# Patient Record
Sex: Male | Born: 1945 | Race: White | Hispanic: No | Marital: Married | Smoking: Never smoker
Health system: Southern US, Community
[De-identification: ages and names within clinical notes are randomized; demographics above are authoritative.]

## PROBLEM LIST (undated history)

## (undated) DIAGNOSIS — I499 Cardiac arrhythmia, unspecified: Secondary | ICD-10-CM

## (undated) DIAGNOSIS — F419 Anxiety disorder, unspecified: Secondary | ICD-10-CM

## (undated) DIAGNOSIS — C801 Malignant (primary) neoplasm, unspecified: Secondary | ICD-10-CM

## (undated) DIAGNOSIS — M1990 Unspecified osteoarthritis, unspecified site: Secondary | ICD-10-CM

## (undated) DIAGNOSIS — F32A Depression, unspecified: Secondary | ICD-10-CM

## (undated) DIAGNOSIS — F329 Major depressive disorder, single episode, unspecified: Secondary | ICD-10-CM

## (undated) HISTORY — PX: PROSTATE BIOPSY: SHX241

---
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
## 2008-06-18 HISTORY — PX: COLONOSCOPY: SHX174

## 2011-06-19 DIAGNOSIS — C801 Malignant (primary) neoplasm, unspecified: Secondary | ICD-10-CM

## 2011-06-19 HISTORY — PX: CT RADIATION THERAPY GUIDE: HXRAD513

## 2011-06-19 HISTORY — DX: Malignant (primary) neoplasm, unspecified: C80.1

## 2018-09-19 ENCOUNTER — Encounter: Admission: RE | Payer: Self-pay | Source: Home / Self Care

## 2018-09-19 ENCOUNTER — Ambulatory Visit: Admission: RE | Admit: 2018-09-19 | Payer: Self-pay | Source: Home / Self Care | Admitting: Gastroenterology

## 2018-09-19 SURGERY — COLONOSCOPY WITH PROPOFOL
Anesthesia: General

## 2019-02-24 ENCOUNTER — Other Ambulatory Visit: Payer: Self-pay

## 2019-02-24 ENCOUNTER — Other Ambulatory Visit
Admission: RE | Admit: 2019-02-24 | Discharge: 2019-02-24 | Disposition: A | Discharge status: Home / Self Care | Payer: Medicare Other | Source: Ambulatory Visit | Attending: Gastroenterology | Admitting: Gastroenterology

## 2019-02-24 DIAGNOSIS — Z01812 Encounter for preprocedural laboratory examination: Secondary | ICD-10-CM | POA: Insufficient documentation

## 2019-02-24 DIAGNOSIS — Z20828 Contact with and (suspected) exposure to other viral communicable diseases: Secondary | ICD-10-CM | POA: Diagnosis not present

## 2019-02-24 LAB — SARS CORONAVIRUS 2 (TAT 6-24 HRS): SARS Coronavirus 2: NEGATIVE

## 2019-02-26 ENCOUNTER — Ambulatory Visit: Payer: Medicare Other | Admitting: Anesthesiology

## 2019-02-26 ENCOUNTER — Encounter
Admission: RE | Disposition: A | Discharge status: Home / Self Care | Payer: Self-pay | Source: Home / Self Care | Attending: Gastroenterology

## 2019-02-26 ENCOUNTER — Ambulatory Visit
Admission: RE | Admit: 2019-02-26 | Discharge: 2019-02-26 | Disposition: A | Discharge status: Home / Self Care | Payer: Medicare Other | Attending: Gastroenterology | Admitting: Gastroenterology

## 2019-02-26 ENCOUNTER — Other Ambulatory Visit: Payer: Self-pay

## 2019-02-26 DIAGNOSIS — K644 Residual hemorrhoidal skin tags: Secondary | ICD-10-CM | POA: Diagnosis not present

## 2019-02-26 DIAGNOSIS — Z791 Long term (current) use of non-steroidal anti-inflammatories (NSAID): Secondary | ICD-10-CM | POA: Diagnosis not present

## 2019-02-26 DIAGNOSIS — F329 Major depressive disorder, single episode, unspecified: Secondary | ICD-10-CM | POA: Insufficient documentation

## 2019-02-26 DIAGNOSIS — F419 Anxiety disorder, unspecified: Secondary | ICD-10-CM | POA: Diagnosis not present

## 2019-02-26 DIAGNOSIS — Z1211 Encounter for screening for malignant neoplasm of colon: Secondary | ICD-10-CM | POA: Insufficient documentation

## 2019-02-26 DIAGNOSIS — I499 Cardiac arrhythmia, unspecified: Secondary | ICD-10-CM | POA: Diagnosis not present

## 2019-02-26 DIAGNOSIS — Z79899 Other long term (current) drug therapy: Secondary | ICD-10-CM | POA: Insufficient documentation

## 2019-02-26 DIAGNOSIS — Z7982 Long term (current) use of aspirin: Secondary | ICD-10-CM | POA: Insufficient documentation

## 2019-02-26 DIAGNOSIS — K5730 Diverticulosis of large intestine without perforation or abscess without bleeding: Secondary | ICD-10-CM | POA: Diagnosis not present

## 2019-02-26 DIAGNOSIS — D123 Benign neoplasm of transverse colon: Secondary | ICD-10-CM | POA: Insufficient documentation

## 2019-02-26 DIAGNOSIS — K648 Other hemorrhoids: Secondary | ICD-10-CM | POA: Diagnosis not present

## 2019-02-26 DIAGNOSIS — M1990 Unspecified osteoarthritis, unspecified site: Secondary | ICD-10-CM | POA: Diagnosis not present

## 2019-02-26 DIAGNOSIS — Z8546 Personal history of malignant neoplasm of prostate: Secondary | ICD-10-CM | POA: Insufficient documentation

## 2019-02-26 DIAGNOSIS — K5520 Angiodysplasia of colon without hemorrhage: Secondary | ICD-10-CM | POA: Diagnosis not present

## 2019-02-26 HISTORY — DX: Depression, unspecified: F32.A

## 2019-02-26 HISTORY — DX: Cardiac arrhythmia, unspecified: I49.9

## 2019-02-26 HISTORY — DX: Unspecified osteoarthritis, unspecified site: M19.90

## 2019-02-26 HISTORY — DX: Anxiety disorder, unspecified: F41.9

## 2019-02-26 HISTORY — PX: COLONOSCOPY WITH PROPOFOL: SHX5780

## 2019-02-26 SURGERY — COLONOSCOPY WITH PROPOFOL
Anesthesia: General

## 2019-02-26 MED ORDER — PROPOFOL 10 MG/ML IV BOLUS
INTRAVENOUS | Status: AC
  Filled 2019-02-26: qty 20

## 2019-02-26 MED ORDER — PROPOFOL 10 MG/ML IV BOLUS
INTRAVENOUS | Status: DC | PRN
  Administered 2019-02-26: 20 mg via INTRAVENOUS
  Administered 2019-02-26: 60 mg via INTRAVENOUS

## 2019-02-26 MED ORDER — SODIUM CHLORIDE 0.9 % IV SOLN
INTRAVENOUS | Status: DC
  Administered 2019-02-26 (×2): via INTRAVENOUS

## 2019-02-26 MED ORDER — PROPOFOL 500 MG/50ML IV EMUL
INTRAVENOUS | Status: DC | PRN
  Administered 2019-02-26: 180 ug/kg/min via INTRAVENOUS

## 2019-02-26 MED ORDER — PHENYLEPHRINE HCL (PRESSORS) 10 MG/ML IV SOLN
INTRAVENOUS | Status: DC | PRN
  Administered 2019-02-26 (×2): 100 ug via INTRAVENOUS

## 2019-02-26 MED ORDER — PROPOFOL 500 MG/50ML IV EMUL
INTRAVENOUS | Status: AC
  Filled 2019-02-26: qty 50

## 2019-02-26 NOTE — H&P (Signed)
Outpatient short stay form Pre-procedure 02/26/2019 10:24 AM Lollie Sails MD  Primary Physician: Dr. Marval Regal, [facility]  Reason for visit: Colonoscopy  History of present illness: Patient is a 73 year old male presenting today for colonoscopy in regards to colon cancer screening.  His last colonoscopy was about 12 years ago.  He does take 81 mg aspirin daily but no other aspirin product or blood thinning agent.  He tolerated his prep well.    Current Facility-Administered Medications:  .  0.9 %  sodium chloride infusion, , Intravenous, Continuous, Lollie Sails, MD  Medications Prior to Admission  Medication Sig Dispense Refill Last Dose  . acetaminophen (TYLENOL) 500 MG tablet Take 500 mg by mouth every 6 (six) hours as needed.   02/25/2019 at Unknown time  . aspirin EC 81 MG tablet Take 81 mg by mouth.   02/21/2019  . diltiazem (CARDIZEM SR) 120 MG 12 hr capsule Take 120 mg by mouth 2 (two) times daily.   02/21/2019  . meloxicam (MOBIC) 7.5 MG tablet Take 7.5 mg by mouth daily.   02/21/2019  . multivitamin-iron-minerals-folic acid (THERAPEUTIC-M) TABS tablet Take 1 tablet by mouth daily.   02/21/2019  . tadalafil (CIALIS) 10 MG tablet Take 10 mg by mouth.     Marland Kitchen aspirin 325 MG tablet Take 325 mg by mouth.   Not Taking at Unknown time  . buPROPion (WELLBUTRIN XL) 150 MG 24 hr tablet Take 150 mg by mouth.   Not Taking at Unknown time  . escitalopram (LEXAPRO) 10 MG tablet Take 10 mg by mouth.   Not Taking at Unknown time  . naproxen sodium (ALEVE) 220 MG tablet Take 220 mg by mouth.   Not Taking at Unknown time     No Known Allergies   Past Medical History:  Diagnosis Date  . Anxiety   . Arthritis    osteo  . Cancer [facility]) 2013   prostate  . Depression   . Dysrhythmia     Review of systems:      Physical Exam    Heart and lungs: Regular rate and rhythm without rub or gallop lungs are bilaterally clear    HEENT: Normocephalic atraumatic eyes are  anicteric    Other:     Pertinant exam for procedure: Soft nontender nondistended bowel sounds positive normoactive    Planned proceedures: Colonoscopy and indicated procedures. I have discussed the risks benefits and complications of procedures to include not limited to bleeding, infection, perforation and the risk of sedation and the patient wishes to proceed.    Lollie Sails, MD Gastroenterology 02/26/2019  10:24 AM

## 2019-02-26 NOTE — Anesthesia Preprocedure Evaluation (Addendum)
Anesthesia Evaluation  Patient identified by MRN, date of birth, ID band Patient awake    Reviewed: Allergy & Precautions, H&P , NPO status , Patient's Chart, lab work & pertinent test results  Airway Mallampati: II  TM Distance: >3 FB     Dental  (+) Teeth Intact   Pulmonary neg pulmonary ROS, neg COPD,           Cardiovascular (-) angina(-) Past MI and (-) Cardiac Stents + dysrhythmias      Neuro/Psych PSYCHIATRIC DISORDERS Anxiety Depression negative neurological ROS     GI/Hepatic negative GI ROS, Neg liver ROS,   Endo/Other  negative endocrine ROS  Renal/GU negative Renal ROS  negative genitourinary   Musculoskeletal   Abdominal   Peds  Hematology negative hematology ROS (+)   Anesthesia Other Findings Past Medical History: No date: Anxiety No date: Arthritis     Comment:  osteo 2013: Cancer ([place])     Comment:  prostate No date: Depression No date: Dysrhythmia  Past Surgical History: 2010: COLONOSCOPY 2013: CT RADIATION THERAPY GUIDE     Comment:  prostate  BMI    Body Mass Index: 25.73 kg/m      Reproductive/Obstetrics negative OB ROS                            Anesthesia Physical Anesthesia Plan  ASA: II  Anesthesia Plan: General   Post-op Pain Management:    Induction:   PONV Risk Score and Plan: Propofol infusion and TIVA  Airway Management Planned: Natural Airway and Nasal Cannula  Additional Equipment:   Intra-op Plan:   Post-operative Plan:   Informed Consent: I have reviewed the patients History and Physical, chart, labs and discussed the procedure including the risks, benefits and alternatives for the proposed anesthesia with the patient or authorized representative who has indicated his/her understanding and acceptance.     Dental Advisory Given  Plan Discussed with: Anesthesiologist and CRNA  Anesthesia Plan Comments:          Anesthesia Quick Evaluation

## 2019-02-26 NOTE — Op Note (Signed)
[facility] Gastroenterology Patient Name: Demerius Galioto Procedure Date: 02/26/2019 10:56 AM MRN: [id number] Account #: [id number] Date of Birth: [birth year] Admit Type: Outpatient Age: 73 [facility] ENDO ROOM 3 Gender: Male Note Status: Finalized Procedure:            Colonoscopy Indications:          Screening for colorectal malignant neoplasm Providers:            Lollie Sails, MD Medicines:            Monitored Anesthesia Care Complications:        No immediate complications. Procedure:            Pre-Anesthesia Assessment:                       - ASA Grade Assessment: II - A patient with mild                        systemic disease.                       After obtaining informed consent, the colonoscope was                        passed under direct vision. Throughout the procedure,                        the patient's blood pressure, pulse, and oxygen                        saturations were monitored continuously. The                        Colonoscope was introduced through the anus and                        advanced to the the cecum, identified by appendiceal                        orifice and ileocecal valve. The colonoscopy was                        performed with moderate difficulty. Successful                        completion of the procedure was aided by changing the                        patient to a supine position. Findings:      Multiple medium-mouthed diverticula were found in the sigmoid colon and       descending colon.      A 21 mm polyp was found in the proximal transverse colon. The polyp was       sessile. The polyp was removed with a lift and cut technique using a       cold snare. The polyp was removed with a piecemeal technique using a       cold snare. Resection and retrieval were complete.      Non-bleeding internal hemorrhoids were found. The hemorrhoids were       medium-sized.      A few small angioectasias with  bleeding  on contact were found in the       distal rectum. Impression:           - Diverticulosis in the sigmoid colon and in the                        descending colon.                       - One 21 mm polyp in the proximal transverse colon,                        removed using lift and cut and a cold snare and removed                        piecemeal using a cold snare. Resected and retrieved.                       - Non-bleeding internal hemorrhoids.                       - A few colonic angioectasias. Recommendation:       - Discharge patient to home.                       - Await pathology results.                       - Telephone GI clinic for pathology results in 5 days. Procedure Code(s):    --- Professional ---                       [phone], Colonoscopy, flexible; with removal of tumor(s),                        polyp(s), or other lesion(s) by snare technique Diagnosis Code(s):    --- Professional ---                       Z12.11, Encounter for screening for malignant neoplasm                        of colon                       K64.8, Other hemorrhoids                       K63.5, Polyp of colon                       K55.20, Angiodysplasia of colon without hemorrhage                       K57.30, Diverticulosis of large intestine without                        perforation or abscess without bleeding CPT copyright 2019 American Medical Association. All rights reserved. The codes documented in this report are preliminary and upon coder review may  be revised to meet current compliance requirements. Lollie Sails, MD 02/26/2019 12:08:03 PM This report has been signed electronically. Number of Addenda: 0 Note Initiated On: 02/26/2019 10:56 AM Scope Withdrawal Time: 0  hours 31 minutes 4 seconds  Total Procedure Duration: 0 hours 56 minutes 35 seconds       [facility]

## 2019-02-26 NOTE — Transfer of Care (Signed)
Immediate Anesthesia Transfer of Care Note  Patient: Kenneth Wheeler  Procedure(s) Performed: COLONOSCOPY WITH PROPOFOL (N/A )  Patient Location: PACU  Anesthesia Type:General  Level of Consciousness: sedated  Airway & Oxygen Therapy: Patient Spontanous Breathing  Post-op Assessment: Report given to RN and Post -op Vital signs reviewed and stable  Post vital signs: Reviewed and stable  Last Vitals:  Vitals Value Taken Time  BP 76/38 02/26/19 1211  Temp 36.3 C 02/26/19 1211  Pulse 73 02/26/19 1215  Resp 15 02/26/19 1215  SpO2 99 % 02/26/19 1215  Vitals shown include unvalidated device data.  Last Pain:  Vitals:   02/26/19 1211  TempSrc: Tympanic  PainSc: Asleep         Complications: No apparent anesthesia complications

## 2019-02-26 NOTE — Anesthesia Postprocedure Evaluation (Signed)
Anesthesia Post Note  Patient: Kenneth Wheeler  Procedure(s) Performed: COLONOSCOPY WITH PROPOFOL (N/A )  Patient location during evaluation: PACU Anesthesia Type: General Level of consciousness: awake and alert Pain management: pain level controlled Vital Signs Assessment: post-procedure vital signs reviewed and stable Respiratory status: spontaneous breathing, nonlabored ventilation and respiratory function stable Cardiovascular status: blood pressure returned to baseline and stable Postop Assessment: no apparent nausea or vomiting Anesthetic complications: no     Last Vitals:  Vitals:   02/26/19 1231 02/26/19 1241  BP: 124/90 127/85  Pulse: 70 (!) 57  Resp: 17 13  Temp:    SpO2: 100% 100%    Last Pain:  Vitals:   02/26/19 1241  TempSrc:   PainSc: 0-No pain                 Durenda Hurt

## 2019-02-26 NOTE — Anesthesia Post-op Follow-up Note (Signed)
Anesthesia QCDR form completed.        

## 2019-02-27 ENCOUNTER — Encounter: Payer: Self-pay | Admitting: Gastroenterology

## 2019-02-27 LAB — SURGICAL PATHOLOGY

## 2020-12-30 ENCOUNTER — Encounter: Payer: Self-pay | Admitting: Orthopedic Surgery

## 2021-01-05 ENCOUNTER — Other Ambulatory Visit: Payer: Self-pay

## 2021-03-03 ENCOUNTER — Ambulatory Visit (INDEPENDENT_AMBULATORY_CARE_PROVIDER_SITE_OTHER): Payer: Medicare Other | Admitting: Urology

## 2021-03-03 ENCOUNTER — Encounter: Payer: Self-pay | Admitting: Urology

## 2021-03-03 ENCOUNTER — Other Ambulatory Visit: Payer: Self-pay

## 2021-03-03 VITALS — BP 162/77 | HR 72

## 2021-03-03 DIAGNOSIS — C61 Malignant neoplasm of prostate: Secondary | ICD-10-CM

## 2021-03-03 LAB — URINALYSIS, ROUTINE W REFLEX MICROSCOPIC
Bilirubin, UA: NEGATIVE
Glucose, UA: NEGATIVE
Ketones, UA: NEGATIVE
Leukocytes,UA: NEGATIVE
Nitrite, UA: NEGATIVE
Protein,UA: NEGATIVE
RBC, UA: NEGATIVE
Specific Gravity, UA: 1.015 (ref 1.005–1.030)
Urobilinogen, Ur: 0.2 mg/dL (ref 0.2–1.0)
pH, UA: 6.5 (ref 5.0–7.5)

## 2021-03-03 MED ORDER — LEVOFLOXACIN 750 MG PO TABS: 750.0000 mg | ORAL_TABLET | Freq: Once | ORAL | 0 refills | Status: AC

## 2021-03-03 NOTE — Progress Notes (Signed)

## 2021-03-03 NOTE — Progress Notes (Signed)
03/03/2021 9:41 AM   Kenneth Wheeler Jan 28, 1946 [id number]  Referring provider: Coolidge Breeze, FNP [address] [place],  [place] [address]  Prostate cancer   HPI: Kenneth Wheeler is a 75yo here for evaluation of prostate cancer. He underwent IMRT in 2013 at [facility] and had 1 year of ADT. PSA nadir was 0.37 in 2014 and has slowly risen to 3.0 in 09/2020. IPSS 7 QOl 1. NO bone pain. No significant LUTS. He uses tadalafil prn for his erections which works well.    PMH: Past Medical History:  Diagnosis Date   Anxiety    Arthritis    osteo   Cancer ([place]) 2013   prostate   Depression    Dysrhythmia     Surgical History: Past Surgical History:  Procedure Laterality Date   COLONOSCOPY  2010   COLONOSCOPY WITH PROPOFOL N/A 02/26/2019   Procedure: COLONOSCOPY WITH PROPOFOL;  Surgeon: Lollie Sails, MD;  Location: [facility] ENDOSCOPY;  Service: Endoscopy;  Laterality: N/A;   CT RADIATION THERAPY GUIDE  2013   prostate    Home Medications:  Allergies as of 03/03/2021   No Known Allergies      Medication List        Accurate as of March 03, 2021  9:41 AM. If you have any questions, ask your nurse or doctor.          acetaminophen 500 MG tablet Commonly known as: TYLENOL Take 500 mg by mouth every 6 (six) hours as needed.   aspirin 325 MG tablet Take 325 mg by mouth.   aspirin EC 81 MG tablet Take 81 mg by mouth.   buPROPion 150 MG 24 hr tablet Commonly known as: WELLBUTRIN XL Take 150 mg by mouth.   diltiazem 120 MG 12 hr capsule Commonly known as: CARDIZEM SR Take 120 mg by mouth 2 (two) times daily.   diltiazem 120 MG 24 hr capsule Commonly known as: CARDIZEM CD Take 120 mg by mouth daily.   escitalopram 10 MG tablet Commonly known as: LEXAPRO Take 10 mg by mouth.   meloxicam 7.5 MG tablet Commonly known as: MOBIC Take 7.5 mg by mouth daily.   multivitamin-iron-minerals-folic acid Tabs tablet Take 1 tablet by mouth daily.   naproxen sodium  220 MG tablet Commonly known as: ALEVE Take 220 mg by mouth.   tadalafil 10 MG tablet Commonly known as: CIALIS Take 20 mg by mouth.        Allergies: No Known Allergies  Family History: No family history on file.  Social History:  reports that he has never smoked. He has never used smokeless tobacco. He reports that he does not currently use alcohol. He reports that he does not use drugs.  ROS: All other review of systems were reviewed and are negative except what is noted above in HPI  Physical Exam: BP (!) 162/77   Pulse 72   Constitutional:  Alert and oriented, No acute distress. HEENT: [place] AT, moist mucus membranes.  Trachea midline, no masses. Cardiovascular: No clubbing, cyanosis, or edema. Respiratory: Normal respiratory effort, no increased work of breathing. GI: Abdomen is soft, nontender, nondistended, no abdominal masses GU: No CVA tenderness. Circumcised phallus. No masses/lesions on penis, testis, scrotum. Prostate 30g smooth no nodules no induration.  Lymph: No cervical or inguinal lymphadenopathy. Skin: No rashes, bruises or suspicious lesions. Neurologic: Grossly intact, no focal deficits, moving all 4 extremities. Psychiatric: Normal mood and affect.  Laboratory Data: No results found for: WBC, HGB,  HCT, MCV, PLT  No results found for: CREATININE  No results found for: PSA  No results found for: TESTOSTERONE  No results found for: HGBA1C  Urinalysis No results found for: COLORURINE, APPEARANCEUR, LABSPEC, PHURINE, GLUCOSEU, HGBUR, BILIRUBINUR, KETONESUR, PROTEINUR, UROBILINOGEN, NITRITE, LEUKOCYTESUR  No results found for: LABMICR, [place], RBCUA, LABEPIT, MUCUS, BACTERIA  Pertinent Imaging:  No results found for this or any previous visit.  No results found for this or any previous visit.  No results found for this or any previous visit.  No results found for this or any previous visit.  No results found for this or any previous  visit.  No results found for this or any previous visit.  No results found for this or any previous visit.  No results found for this or any previous visit.   Assessment & Plan:    1. Malignant neoplasm of prostate [facility]) The patient and I talked about etiologies of elevated PSA.  We discussed the possible relationship between elevated PSA, prostate cancer, BPH, prostatitis, and UTI.   Conservative treatment of elevated PSA with watchful waiting was discussed with the patient.  All questions were answered.        All of the risks and benefits along with alternatives to prostate biopsy were discussed with the patient.  The patient gave fully informed consent to proceed with a transrectal ultrasound guided biopsy of the prostate for the evaluation of their evated PSA.  Prostate biopsy instructions and antibiotics were given to the patient.  - Urinalysis, Routine w reflex microscopic   No follow-ups on file.  Nicolette Bang, MD  [facility] [place]

## 2021-03-03 NOTE — Patient Instructions (Addendum)
Transrectal Ultrasound-Guided Prostate Biopsy A transrectal ultrasound-guided prostate biopsy is a procedure to remove samples of prostate tissue for testing. The procedure uses ultrasound images to guide the process of removing the samples. The samples are taken to a lab to be checked for prostate cancer. This procedure is usually done to evaluate the prostate gland of men who have raised (elevated) levels of prostate-specific antigen (PSA), which can be a sign of prostate cancer. Tell a health care provider about: Any allergies you have. All medicines you are taking, including vitamins, herbs, eye drops, creams, and over-the-counter medicines. Any problems you or family members have had with anesthetic medicines. Any blood disorders you have. Any surgeries you have had. Any medical conditions you have. What are the risks? Generally, this is a safe procedure. However, problems may occur, including: Prostate infection. Bleeding from the rectum. Blood in the urine. Allergic reactions to medicines. Damage to surrounding structures such as blood vessels, organs, or muscles. Difficulty passing urine. Nerve damage. This is usually temporary. Pain. What happens before the procedure? Eating and drinking restrictions Follow instructions from your health care provider about eating and drinking. In most instances, you will not need to stop eating and drinking completely before the procedure. Medicines Ask your health care provider about: Changing or stopping your regular medicines. This is especially important if you are taking diabetes medicines or blood thinners. Taking medicines such as aspirin and ibuprofen. These medicines can thin your blood. Do not take these medicines unless your health care provider tells you to take them. Taking over-the-counter medicines, vitamins, herbs, and supplements. General instructions You will be given an enema. During an enema, a liquid is injected into your  rectum to clear out waste. You may have a blood or urine sample taken. Plan to have a responsible adult take you home from the hospital or clinic. If you will be going home right after the procedure, plan to have a responsible adult care for you for the time you are told. This is important. Ask your health care provider what steps will be taken to help prevent infection. These steps may include: Washing skin with a germ-killing soap. Taking antibiotic medicine. What happens during the procedure?  You will be given one or both of the following: A medicine to help you relax (sedative). A medicine to numb the area (local anesthetic). You will be placed on your left side, and your knees may be bent. A probe with lubricated gel will be placed into your rectum, and images will be taken of your prostate and surrounding structures. Numbing medicine will be injected into your prostate. A biopsy needle will be inserted through your rectum and guided to your prostate using the ultrasound images. Prostate tissue samples will be removed, and the needle will then be removed. The biopsy samples will be sent to a lab to be tested. The procedure may vary among health care providers and hospitals. What happens after the procedure? Your blood pressure, heart rate, breathing rate, and blood oxygen level will be monitored until the medicines you were given have worn off. You may have some discomfort in the rectal area. You will be given pain medicine as needed. Do not drive for 24 hours if you received a sedative. Summary A transrectal ultrasound-guided biopsy removes samples of tissue from your prostate. This procedure is usually done to evaluate the prostate gland of men who have raised (elevated) levels of prostate-specific antigen (PSA), which can be a sign of prostate cancer. After your  procedure, you may feel some discomfort in the rectal area. Plan to have a responsible adult take you home from the  hospital or clinic. This information is not intended to replace advice given to you by your health care provider. Make sure you discuss any questions you have with your health care provider. Document Revised: 03/18/2020 Document Reviewed: 02/18/2020 Elsevier Patient Education  2022 [place].      Appointment Time:12:30p Please arrive by 12:15p Appointment Date:04/05/2021  Location: [facility]   Prostate Biopsy Instructions  Stop all aspirin or blood thinners (aspirin, plavix, coumadin, warfarin, motrin, ibuprofen, advil, aleve, naproxen, naprosyn) for 7 days prior to the procedure.  If you have any questions about stopping these medications, please contact your primary care physician or cardiologist.  Having a light meal prior to the procedure is recommended.  If you are diabetic or have low blood sugar please bring a small snack or glucose tablet.  A Fleets enema is needed to be purchased over the counter at a local pharmacy and used 2 hours before you scheduled appointment.  This can be purchased over the counter at any pharmacy.  Antibiotics will be administered in the clinic at the time of the procedure and 1 tablet has been sent to your pharmacy. Please take the antibiotic as prescribed.    Please bring someone with you to the procedure to drive you home if you are given a valium to take prior to your procedure.   If you have any questions or concerns, please feel free to call the office at [phone] or send a Mychart message.    Thank you, [facility]

## 2021-03-03 NOTE — Addendum Note (Signed)
Addended byIris Pert on: 03/03/2021 10:17 AM   Modules accepted: Orders

## 2021-03-04 LAB — PSA: Prostate Specific Ag, Serum: 5.3 ng/mL — ABNORMAL HIGH (ref 0.0–4.0)

## 2021-04-05 ENCOUNTER — Other Ambulatory Visit: Payer: Self-pay | Admitting: Urology

## 2021-04-05 ENCOUNTER — Ambulatory Visit (HOSPITAL_COMMUNITY)
Admission: RE | Admit: 2021-04-05 | Discharge: 2021-04-05 | Disposition: A | Discharge status: Home / Self Care | Payer: Medicare Other | Source: Ambulatory Visit | Attending: Urology | Admitting: Urology

## 2021-04-05 ENCOUNTER — Ambulatory Visit (INDEPENDENT_AMBULATORY_CARE_PROVIDER_SITE_OTHER): Payer: Medicare Other | Admitting: Urology

## 2021-04-05 ENCOUNTER — Other Ambulatory Visit: Payer: Self-pay

## 2021-04-05 ENCOUNTER — Encounter (HOSPITAL_COMMUNITY): Payer: Self-pay

## 2021-04-05 DIAGNOSIS — C61 Malignant neoplasm of prostate: Secondary | ICD-10-CM | POA: Diagnosis present

## 2021-04-05 MED ORDER — GENTAMICIN SULFATE 40 MG/ML IJ SOLN: 80.0000 mg | Freq: Once | INTRAMUSCULAR | Status: AC

## 2021-04-05 MED ORDER — LIDOCAINE HCL (PF) 2 % IJ SOLN
INTRAMUSCULAR | Status: AC
  Administered 2021-04-05: 10 mL
  Filled 2021-04-05: qty 10

## 2021-04-05 MED ORDER — GENTAMICIN SULFATE 40 MG/ML IJ SOLN
INTRAMUSCULAR | Status: AC
  Administered 2021-04-05: 80 mg via INTRAMUSCULAR
  Filled 2021-04-05: qty 2

## 2021-04-05 MED ORDER — LIDOCAINE HCL (PF) 2 % IJ SOLN: 10.0000 mL | Freq: Once | INTRAMUSCULAR | Status: AC

## 2021-04-05 NOTE — Progress Notes (Signed)
PT tolerated prostate biopsy procedure and antibiotic injection well today. Labs obtained and sent for pathology. PT ambulatory at discharge with no acute distress noted and verbalized understanding of discharge instructions. PT to follow up with urologist as scheduled on 04/12/21 @ 3:30.

## 2021-04-06 ENCOUNTER — Encounter: Payer: Self-pay | Admitting: Anesthesiology

## 2021-04-07 ENCOUNTER — Ambulatory Visit: Admission: RE | Admit: 2021-04-07 | Payer: Medicare Other | Source: Home / Self Care

## 2021-04-07 ENCOUNTER — Encounter: Admission: RE | Payer: Self-pay | Source: Home / Self Care

## 2021-04-07 SURGERY — COLONOSCOPY WITH PROPOFOL
Anesthesia: General

## 2021-04-12 ENCOUNTER — Ambulatory Visit (INDEPENDENT_AMBULATORY_CARE_PROVIDER_SITE_OTHER): Payer: Medicare Other | Admitting: Urology

## 2021-04-12 ENCOUNTER — Encounter: Payer: Self-pay | Admitting: Urology

## 2021-04-12 ENCOUNTER — Other Ambulatory Visit: Payer: Self-pay

## 2021-04-12 VITALS — BP 128/79 | HR 79 | Ht 73.0 in | Wt 209.5 lb

## 2021-04-12 DIAGNOSIS — C61 Malignant neoplasm of prostate: Secondary | ICD-10-CM | POA: Diagnosis not present

## 2021-04-12 NOTE — Progress Notes (Signed)
04/12/2021 4:15 PM   Kenneth Wheeler December 28, 1945 [id number]  Referring provider: No referring provider defined for this encounter.  Chief Complaint  Patient presents with   Advice Only    Here to discuss Biospy    HPI: [name] is a 75yo here for followup after prostate biopsy. Biopsy revealed Gleason 4+5=9 in 6/ 12 cores. PSA 5.3. He has mild LUTS. He denies any issues with erectile dysfunction.    PMH: Past Medical History:  Diagnosis Date   Anxiety    Arthritis    osteo   Cancer ([place]) 2013   prostate   Depression    Dysrhythmia     Surgical History: Past Surgical History:  Procedure Laterality Date   COLONOSCOPY  2010   COLONOSCOPY WITH PROPOFOL N/A 02/26/2019   Procedure: COLONOSCOPY WITH PROPOFOL;  Surgeon: Lollie Sails, MD;  Location: [facility] ENDOSCOPY;  Service: Endoscopy;  Laterality: N/A;   CT RADIATION THERAPY GUIDE  2013   prostate    Home Medications:  Allergies as of 04/12/2021   Not on File      Medication List        Accurate as of April 12, 2021  4:15 PM. If you have any questions, ask your nurse or doctor.          acetaminophen 500 MG tablet Commonly known as: TYLENOL Take 500 mg by mouth every 6 (six) hours as needed.   aspirin 325 MG tablet Take 325 mg by mouth.   aspirin EC 81 MG tablet Take 81 mg by mouth.   buPROPion 150 MG 24 hr tablet Commonly known as: WELLBUTRIN XL Take 150 mg by mouth.   diltiazem 120 MG 12 hr capsule Commonly known as: CARDIZEM SR Take 120 mg by mouth 2 (two) times daily.   diltiazem 120 MG 24 hr capsule Commonly known as: CARDIZEM CD Take 120 mg by mouth daily.   escitalopram 10 MG tablet Commonly known as: LEXAPRO Take 10 mg by mouth.   meloxicam 7.5 MG tablet Commonly known as: MOBIC Take 7.5 mg by mouth daily.   multivitamin-iron-minerals-folic acid Tabs tablet Take 1 tablet by mouth daily.   naproxen sodium 220 MG tablet Commonly known as: ALEVE Take 220 mg by mouth.    tadalafil 10 MG tablet Commonly known as: CIALIS Take 20 mg by mouth.        Allergies: Not on File  Family History: History reviewed. No pertinent family history.  Social History:  reports that he has never smoked. He has never used smokeless tobacco. He reports that he does not currently use alcohol. He reports that he does not use drugs.  ROS: All other review of systems were reviewed and are negative except what is noted above in HPI  Physical Exam: BP 128/79   Pulse 79   Ht 6\' 1"  (1.854 m)   Wt 209 lb 8 oz (95 kg)   BMI 27.64 kg/m   Constitutional:  Alert and oriented, No acute distress. HEENT: [place] AT, moist mucus membranes.  Trachea midline, no masses. Cardiovascular: No clubbing, cyanosis, or edema. Respiratory: Normal respiratory effort, no increased work of breathing. GI: Abdomen is soft, nontender, nondistended, no abdominal masses GU: No CVA tenderness.  Lymph: No cervical or inguinal lymphadenopathy. Skin: No rashes, bruises or suspicious lesions. Neurologic: Grossly intact, no focal deficits, moving all 4 extremities. Psychiatric: Normal mood and affect.  Laboratory Data: No results found for: WBC, HGB, HCT, MCV, PLT  No results found for: CREATININE  No results found for: PSA  No results found for: TESTOSTERONE  No results found for: HGBA1C  Urinalysis    Component Value Date/Time   APPEARANCEUR Clear 03/03/2021 0935   GLUCOSEU Negative 03/03/2021 0935   BILIRUBINUR Negative 03/03/2021 0935   PROTEINUR Negative 03/03/2021 0935   NITRITE Negative 03/03/2021 0935   LEUKOCYTESUR Negative 03/03/2021 0935    Lab Results  Component Value Date   LABMICR Comment 03/03/2021    Pertinent Imaging:  No results found for this or any previous visit.  No results found for this or any previous visit.  No results found for this or any previous visit.  No results found for this or any previous visit.  No results found for this or any previous  visit.  No results found for this or any previous visit.  No results found for this or any previous visit.  No results found for this or any previous visit.   Assessment & Plan:    1. Prostate cancer [facility]) I discussed the natural history of lhigh risk prostate cancer with the patient and the various treatment options including active surveillance, cryotherapy, and ADT.  We will obtain metastatic survey prior to initiating therapy.    No follow-ups on file.  Nicolette Bang, MD  [facility] [place]

## 2021-04-12 NOTE — Progress Notes (Signed)
Prostate Biopsy Procedure   Informed consent was obtained after discussing risks/benefits of the procedure.  A time out was performed to ensure correct patient identity.  Pre-Procedure: - Last PSA Level: No results found for: PSA - Gentamicin given prophylactically - Levaquin 500 mg administered PO -Transrectal Ultrasound performed revealing a 44.8 gm prostate -No significant hypoechoic or median lobe noted  Procedure: - Prostate block performed using 10 cc 1% lidocaine and biopsies taken from sextant areas, a total of 12 under ultrasound guidance.  Post-Procedure: - Patient tolerated the procedure well - He was counseled to seek immediate medical attention if experiences any severe pain, significant bleeding, or fevers - Return in one week to discuss biopsy results

## 2021-04-12 NOTE — Patient Instructions (Signed)
Transrectal Ultrasound-Guided Prostate Biopsy, Care After This sheet gives you information about how to care for yourself after your procedure. Your doctor may also give you more specific instructions. If youhave problems or questions, contact your doctor. What can I expect after the procedure? After the procedure, it is common to have: Pain and discomfort in your butt, especially while sitting. Pink-colored pee (urine), due to small amounts of blood in the pee. Burning while peeing (urinating). Blood in your poop (stool). Bleeding from your butt. Blood in your semen. Follow these instructions at home: Medicines Take over-the-counter and prescription medicines only as told by your doctor. If you were prescribed antibiotic medicine, take it as told by your doctor. Do not stop taking the antibiotic even if you start to feel better. Activity  Do not drive for 24 hours if you were given a medicine to help you relax (sedative) during your procedure. Return to your normal activities as told by your doctor. Ask your doctor what activities are safe for you. Ask your doctor when it is okay for you to have sex. Do not lift anything that is heavier than 10 lb (4.5 kg), or the limit that you are told, until your doctor says that it is safe.  General instructions  Drink enough water to keep your pee pale yellow. Watch your pee, poop, and semen for new bleeding or bleeding that gets worse. Keep all follow-up visits as told by your doctor. This is important.  Contact a doctor if you: Have blood clots in your pee or poop. Notice that your pee smells bad or unusual. Have very bad belly pain. Have trouble peeing. Notice that your lower belly feels firm. Have blood in your pee for more than 2 weeks after the procedure. Have blood in your semen for more than 2 months after the procedure. Have problems getting an erection. Feel sick to your stomach (nauseous) or throw up (vomit). Have new or worse  bleeding in your pee, poop, or semen. Get help right away if you: Have a fever or chills. Have bright red pee. Have very bad pain that does not get better with medicine. Cannot pee. Summary After this procedure, it is common to have pain and discomfort around your butt, especially while sitting. You may have blood in your pee and poop. It is common to have blood in your semen for 1-2 months. If you were prescribed antibiotic medicine, take it as told by your doctor. Do not stop taking the antibiotic even if you start to feel better. Get help right away if you have a fever or chills. This information is not intended to replace advice given to you by your health care provider. Make sure you discuss any questions you have with your healthcare provider. Document Revised: 04/18/2020 Document Reviewed: 02/18/2020 Elsevier Patient Education  2022 Elsevier Inc.  

## 2021-04-12 NOTE — Patient Instructions (Signed)
Prostate Cancer The prostate is a small gland that produces fluid that makes up semen (seminal fluid). It is located below the bladder in men, in front of the rectum. Prostate cancer is the abnormal growth of cells in the prostate gland. What are the causes? The exact cause of this condition is not known. What increases the risk? You are more likely to develop this condition if: You are 74 years of age or older. You have a family history of prostate cancer. You have a family history of breast and ovarian cancer. You have genes that are passed from parent to child (inherited), such as BRCA1 and BRCA2. You have Lynch syndrome. African American men and men of African descent are diagnosed with prostate cancer at higher rates than other men. The reasons for this are not well understood and are likely due to a combination of genetic and environmental factors. What are the signs or symptoms? Symptoms of this condition include: Problems with urination. This may include: A weak or interrupted flow of urine. Trouble starting or stopping urination. Trouble emptying the bladder all the way. The need to urinate more often, especially at night. Blood in urine or semen. Persistent pain or discomfort in the lower back, lower abdomen, or hips. Trouble getting an erection. Weakness or numbness in the legs or feet. How is this diagnosed? This condition can be diagnosed with: A digital rectal exam. For this exam, a health care provider inserts a gloved finger into the rectum to feel the prostate gland. A blood test called a prostate-specific antigen (PSA) test. A procedure in which a sample of tissue is taken from the prostate and checked under a microscope (prostate biopsy). An imaging test called transrectal ultrasonography. Once the condition is diagnosed, tests will be done to determine how far the cancer has spread. This is called staging the cancer. Staging may involve imaging tests, such as a bone  scan, CT scan, PET scan, or MRI. Stages of prostate cancer The stages of prostate cancer are as follows: Stage 1 (I). At this stage, the cancer is found in the prostate only. The cancer is not visible on imaging tests, and it is usually found by accident, such as during prostate surgery. Stage 2 (II). At this stage, the cancer is more advanced than it is in stage 1, but the cancer has not spread outside the prostate. Stage 3 (III). At this stage, the cancer has spread beyond the outer layer of the prostate to nearby tissues. The cancer may be found in the seminal vesicles, which are near the bladder and the prostate. Stage 4 (IV). At this stage, the cancer has spread to other parts of the body, such as the lymph nodes, bones, bladder, rectum, liver, or lungs. Prostate cancer grading Prostate cancer is also graded according to how the cancer cells look under a microscope. This is called the Gleason score and the total score can range from 6-10, indicating how likely it is that the cancer will spread (metastasize) to other parts of the body. The higher the score, the greater the likelihood that the cancer will spread. Gleason 6 or lower: This indicates that the cancer cells look similar to normal prostate cells (well differentiated). Gleason 7: This indicates that the cancer cells look somewhat similar to normal prostate cells (moderately differentiated). Gleason 8, 9, or 10: This indicates that the cancer cells look very different than normal prostate cells (poorly differentiated). How is this treated? Treatment for this condition depends on several factors,  including the stage of the cancer, your age, personal preferences, and your overall health. Talk with your health care provider about treatment options that are recommended for you. Common treatments include: Observation for early stage prostate cancer (active surveillance). This involves having exams, blood tests, and in some cases, more biopsies.  For some men, this is the only treatment needed. Surgery. Types of surgeries include: Open surgery (radical prostatectomy). In this surgery, a larger incision is made to remove the prostate. A laparoscopic radical prostatectomy. This is a surgery to remove the prostate and lymph nodes through several small incisions. It is often referred to as a minimally invasive surgery. A robotic radical prostatectomy. This is laparoscopic surgery to remove the prostate and lymph nodes with the help of robotic arms that are controlled by the surgeon. Cryoablation. This is surgery to freeze and destroy cancer cells. Radiation treatment. Types of radiation treatment include: External beam radiation. This type aims beams of radiation from outside the body at the prostate to destroy cancerous cells. Brachytherapy. This type uses radioactive needles, seeds, wires, or tubes that are implanted into the prostate gland. Like external beam radiation, brachytherapy destroys cancerous cells. An advantage is that this type of radiation limits the damage to surrounding tissue and has fewer side effects. Chemotherapy. This treatment kills cancer cells or stops them from multiplying. It kills both cancer cells and normal cells. Targeted therapy. This treatment uses medicines to kill cancer cells without damaging normal cells. Hormone treatment. This treatment involves taking medicines that act on testosterone, one of the male hormones, by: Stopping your body from producing testosterone. Blocking testosterone from reaching cancer cells. Follow these instructions at home: Lifestyle Do not use any products that contain nicotine or tobacco. These products include cigarettes, chewing tobacco, and vaping devices, such as e-cigarettes. If you need help quitting, ask your health care provider. Eat a healthy diet. To do this: Eat foods that are high in fiber. These include beans, whole grains, and fresh fruits and vegetables. Limit  foods that are high in fat and sugar. These include fried or sweet foods. Treatment for prostate cancer may affect sexual function. If you have a partner, continue to have intimate moments. This may include touching, holding, hugging, and caressing your partner. Get plenty of sleep. Consider joining a support group for men who have prostate cancer. Meeting with a support group may help you learn to manage the stress of having cancer. General instructions Take over-the-counter and prescription medicines only as told by your health care provider. If you have to go to the hospital, notify your cancer specialist (oncologist). Keep all follow-up visits. This is important. Where to find more information American Cancer Society: www.cancer.org American Society of Clinical Oncology: www.cancer.net National Cancer Institute: www.cancer.gov Contact a health care provider if: You have new or increasing trouble urinating. You have new or increasing blood in your urine. You have new or increasing pain in your hips, back, or chest. Get help right away if: You have weakness or numbness in your legs. You cannot control urination or your bowel movements (incontinence). You have chills or a fever. Summary The prostate is a small gland that is involved in the production of semen. It is located below a man's bladder, in front of the rectum. Prostate cancer is the abnormal growth of cells in the prostate gland. Treatment for this condition depends on the stage of the cancer, your age, personal preferences, and your overall health. Talk with your health care provider about treatment   options that are recommended for you. Consider joining a support group for men who have prostate cancer. Meeting with a support group may help you learn to manage the stress of having cancer. This information is not intended to replace advice given to you by your health care provider. Make sure you discuss any questions you have with  your health care provider. Document Revised: 08/31/2020 Document Reviewed: 08/31/2020 Elsevier Patient Education  2022 Elsevier Inc.  

## 2021-04-13 ENCOUNTER — Other Ambulatory Visit: Payer: Medicare Other

## 2021-04-14 LAB — BASIC METABOLIC PANEL
BUN/Creatinine Ratio: 11 (ref 10–24)
BUN: 11 mg/dL (ref 8–27)
CO2: 24 mmol/L (ref 20–29)
Calcium: 9.3 mg/dL (ref 8.6–10.2)
Chloride: 104 mmol/L (ref 96–106)
Creatinine, Ser: 0.96 mg/dL (ref 0.76–1.27)
Glucose: 96 mg/dL (ref 70–99)
Potassium: 4.6 mmol/L (ref 3.5–5.2)
Sodium: 142 mmol/L (ref 134–144)
eGFR: 83 mL/min/{1.73_m2} (ref >59)

## 2021-05-03 ENCOUNTER — Ambulatory Visit: Payer: BC Managed Care – PPO | Admitting: Urology

## 2021-05-04 ENCOUNTER — Encounter (HOSPITAL_COMMUNITY)
Admission: RE | Admit: 2021-05-04 | Discharge: 2021-05-04 | Disposition: A | Discharge status: Home / Self Care | Payer: Medicare Other | Source: Ambulatory Visit | Attending: Urology | Admitting: Urology

## 2021-05-04 ENCOUNTER — Other Ambulatory Visit: Payer: Self-pay

## 2021-05-04 ENCOUNTER — Ambulatory Visit (HOSPITAL_COMMUNITY)
Admission: RE | Admit: 2021-05-04 | Discharge: 2021-05-04 | Disposition: A | Discharge status: Home / Self Care | Payer: Medicare Other | Source: Ambulatory Visit | Attending: Urology | Admitting: Urology

## 2021-05-04 DIAGNOSIS — C61 Malignant neoplasm of prostate: Secondary | ICD-10-CM | POA: Insufficient documentation

## 2021-05-04 MED ORDER — TECHNETIUM TC 99M MEDRONATE IV KIT
20.0000 | PACK | Freq: Once | INTRAVENOUS | Status: AC | PRN
  Administered 2021-05-04: 10:00:00 19.3 via INTRAVENOUS

## 2021-05-04 MED ORDER — IOHEXOL 300 MG/ML  SOLN
100.0000 mL | Freq: Once | INTRAMUSCULAR | Status: AC | PRN
  Administered 2021-05-04: 10:00:00 100 mL via INTRAVENOUS

## 2021-05-10 ENCOUNTER — Ambulatory Visit (INDEPENDENT_AMBULATORY_CARE_PROVIDER_SITE_OTHER): Payer: Medicare Other | Admitting: Urology

## 2021-05-10 ENCOUNTER — Other Ambulatory Visit: Payer: Self-pay

## 2021-05-10 ENCOUNTER — Encounter: Payer: Self-pay | Admitting: Urology

## 2021-05-10 DIAGNOSIS — C61 Malignant neoplasm of prostate: Secondary | ICD-10-CM | POA: Diagnosis not present

## 2021-05-10 NOTE — Patient Instructions (Signed)
Prostate Cancer The prostate is a small gland that produces fluid that makes up semen (seminal fluid). It is located below the bladder in men, in front of the rectum. Prostate cancer is the abnormal growth of cells in the prostate gland. What are the causes? The exact cause of this condition is not known. What increases the risk? You are more likely to develop this condition if: You are 74 years of age or older. You have a family history of prostate cancer. You have a family history of breast and ovarian cancer. You have genes that are passed from parent to child (inherited), such as BRCA1 and BRCA2. You have Lynch syndrome. African American men and men of African descent are diagnosed with prostate cancer at higher rates than other men. The reasons for this are not well understood and are likely due to a combination of genetic and environmental factors. What are the signs or symptoms? Symptoms of this condition include: Problems with urination. This may include: A weak or interrupted flow of urine. Trouble starting or stopping urination. Trouble emptying the bladder all the way. The need to urinate more often, especially at night. Blood in urine or semen. Persistent pain or discomfort in the lower back, lower abdomen, or hips. Trouble getting an erection. Weakness or numbness in the legs or feet. How is this diagnosed? This condition can be diagnosed with: A digital rectal exam. For this exam, a health care provider inserts a gloved finger into the rectum to feel the prostate gland. A blood test called a prostate-specific antigen (PSA) test. A procedure in which a sample of tissue is taken from the prostate and checked under a microscope (prostate biopsy). An imaging test called transrectal ultrasonography. Once the condition is diagnosed, tests will be done to determine how far the cancer has spread. This is called staging the cancer. Staging may involve imaging tests, such as a bone  scan, CT scan, PET scan, or MRI. Stages of prostate cancer The stages of prostate cancer are as follows: Stage 1 (I). At this stage, the cancer is found in the prostate only. The cancer is not visible on imaging tests, and it is usually found by accident, such as during prostate surgery. Stage 2 (II). At this stage, the cancer is more advanced than it is in stage 1, but the cancer has not spread outside the prostate. Stage 3 (III). At this stage, the cancer has spread beyond the outer layer of the prostate to nearby tissues. The cancer may be found in the seminal vesicles, which are near the bladder and the prostate. Stage 4 (IV). At this stage, the cancer has spread to other parts of the body, such as the lymph nodes, bones, bladder, rectum, liver, or lungs. Prostate cancer grading Prostate cancer is also graded according to how the cancer cells look under a microscope. This is called the Gleason score and the total score can range from 6-10, indicating how likely it is that the cancer will spread (metastasize) to other parts of the body. The higher the score, the greater the likelihood that the cancer will spread. Gleason 6 or lower: This indicates that the cancer cells look similar to normal prostate cells (well differentiated). Gleason 7: This indicates that the cancer cells look somewhat similar to normal prostate cells (moderately differentiated). Gleason 8, 9, or 10: This indicates that the cancer cells look very different than normal prostate cells (poorly differentiated). How is this treated? Treatment for this condition depends on several factors,  including the stage of the cancer, your age, personal preferences, and your overall health. Talk with your health care provider about treatment options that are recommended for you. Common treatments include: Observation for early stage prostate cancer (active surveillance). This involves having exams, blood tests, and in some cases, more biopsies.  For some men, this is the only treatment needed. Surgery. Types of surgeries include: Open surgery (radical prostatectomy). In this surgery, a larger incision is made to remove the prostate. A laparoscopic radical prostatectomy. This is a surgery to remove the prostate and lymph nodes through several small incisions. It is often referred to as a minimally invasive surgery. A robotic radical prostatectomy. This is laparoscopic surgery to remove the prostate and lymph nodes with the help of robotic arms that are controlled by the surgeon. Cryoablation. This is surgery to freeze and destroy cancer cells. Radiation treatment. Types of radiation treatment include: External beam radiation. This type aims beams of radiation from outside the body at the prostate to destroy cancerous cells. Brachytherapy. This type uses radioactive needles, seeds, wires, or tubes that are implanted into the prostate gland. Like external beam radiation, brachytherapy destroys cancerous cells. An advantage is that this type of radiation limits the damage to surrounding tissue and has fewer side effects. Chemotherapy. This treatment kills cancer cells or stops them from multiplying. It kills both cancer cells and normal cells. Targeted therapy. This treatment uses medicines to kill cancer cells without damaging normal cells. Hormone treatment. This treatment involves taking medicines that act on testosterone, one of the male hormones, by: Stopping your body from producing testosterone. Blocking testosterone from reaching cancer cells. Follow these instructions at home: Lifestyle Do not use any products that contain nicotine or tobacco. These products include cigarettes, chewing tobacco, and vaping devices, such as e-cigarettes. If you need help quitting, ask your health care provider. Eat a healthy diet. To do this: Eat foods that are high in fiber. These include beans, whole grains, and fresh fruits and vegetables. Limit  foods that are high in fat and sugar. These include fried or sweet foods. Treatment for prostate cancer may affect sexual function. If you have a partner, continue to have intimate moments. This may include touching, holding, hugging, and caressing your partner. Get plenty of sleep. Consider joining a support group for men who have prostate cancer. Meeting with a support group may help you learn to manage the stress of having cancer. General instructions Take over-the-counter and prescription medicines only as told by your health care provider. If you have to go to the hospital, notify your cancer specialist (oncologist). Keep all follow-up visits. This is important. Where to find more information American Cancer Society: www.cancer.org American Society of Clinical Oncology: www.cancer.net National Cancer Institute: www.cancer.gov Contact a health care provider if: You have new or increasing trouble urinating. You have new or increasing blood in your urine. You have new or increasing pain in your hips, back, or chest. Get help right away if: You have weakness or numbness in your legs. You cannot control urination or your bowel movements (incontinence). You have chills or a fever. Summary The prostate is a small gland that is involved in the production of semen. It is located below a man's bladder, in front of the rectum. Prostate cancer is the abnormal growth of cells in the prostate gland. Treatment for this condition depends on the stage of the cancer, your age, personal preferences, and your overall health. Talk with your health care provider about treatment   options that are recommended for you. Consider joining a support group for men who have prostate cancer. Meeting with a support group may help you learn to manage the stress of having cancer. This information is not intended to replace advice given to you by your health care provider. Make sure you discuss any questions you have with  your health care provider. Document Revised: 08/31/2020 Document Reviewed: 08/31/2020 Elsevier Patient Education  2022 Elsevier Inc.  

## 2021-05-10 NOTE — Progress Notes (Signed)
05/10/2021 11:55 AM   Kenneth Wheeler 1946-01-19 [id number]  Referring provider: No referring provider defined for this encounter.  Patient location: home Physician location: office I connected with  Torrence Hammack on 05/10/21 by a video enabled telemedicine application and verified that I am speaking with the correct person using two identifiers.   I discussed the limitations of evaluation and management by telemedicine. The patient expressed understanding and agreed to proceed.    Followup Prostate Cancer  HPI: Mr Schedler is a 75yo here for followup for prostate cancer. He underwent CT and bone scan which showed no evidence of metastatic disease. PSA 5.3. 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 No significant LUTS.   PMH: Past Medical History:  Diagnosis Date   Anxiety    Arthritis    osteo   Cancer ([place]) 2013   prostate   Depression    Dysrhythmia     Surgical History: Past Surgical History:  Procedure Laterality Date   COLONOSCOPY  2010   COLONOSCOPY WITH PROPOFOL N/A 02/26/2019   Procedure: COLONOSCOPY WITH PROPOFOL;  Surgeon: Lollie Sails, MD;  Location: [facility] ENDOSCOPY;  Service: Endoscopy;  Laterality: N/A;   CT RADIATION THERAPY GUIDE  2013   prostate    Home Medications:  Allergies as of 05/10/2021   Not on File      Medication List        Accurate as of May 10, 2021 11:55 AM. If you have any questions, ask your nurse or doctor.          acetaminophen 500 MG tablet Commonly known as: TYLENOL Take 500 mg by mouth every 6 (six) hours as needed.   aspirin 325 MG tablet Take 325 mg by mouth.   aspirin EC 81 MG tablet Take 81 mg by mouth.   buPROPion 150 MG 24 hr tablet Commonly known as: WELLBUTRIN XL Take 150 mg by mouth.   diltiazem 120 MG 12 hr capsule Commonly known as: CARDIZEM SR Take 120 mg by mouth 2 (two) times daily.   diltiazem 120 MG 24 hr capsule Commonly known as: CARDIZEM CD Take 120 mg by  mouth daily.   escitalopram 10 MG tablet Commonly known as: LEXAPRO Take 10 mg by mouth.   meloxicam 7.5 MG tablet Commonly known as: MOBIC Take 7.5 mg by mouth daily.   multivitamin-iron-minerals-folic acid Tabs tablet Take 1 tablet by mouth daily.   naproxen sodium 220 MG tablet Commonly known as: ALEVE Take 220 mg by mouth.   tadalafil 10 MG tablet Commonly known as: CIALIS Take 20 mg by mouth.        Allergies: Not on File  Family History: No family history on file.  Social History:  reports that he has never smoked. He has never used smokeless tobacco. He reports that he does not currently use alcohol. He reports that he does not use drugs.  ROS: All other review of systems were reviewed and are negative except what is noted above in HPI   Laboratory Data: No results found for: WBC, HGB, HCT, MCV, PLT  Lab Results  Component Value Date   CREATININE 0.96 04/13/2021    No results found for: PSA  No results found for: TESTOSTERONE  No results found for: HGBA1C  Urinalysis    Component Value Date/Time   APPEARANCEUR Clear 03/03/2021 0935   GLUCOSEU Negative 03/03/2021 0935   BILIRUBINUR Negative 03/03/2021 0935   PROTEINUR Negative 03/03/2021 0935   NITRITE Negative 03/03/2021 0935  LEUKOCYTESUR Negative 03/03/2021 0935    Lab Results  Component Value Date   LABMICR Comment 03/03/2021    Pertinent Imaging:  No results found for this or any previous visit.  No results found for this or any previous visit.  No results found for this or any previous visit.  No results found for this or any previous visit.  No results found for this or any previous visit.  No results found for this or any previous visit.  No results found for this or any previous visit.  No results found for this or any previous visit.   Assessment & Plan:    1. Prostate cancer [facility]) I discussed the natural history of high risk prostate cancer with the patient and  the various treatment options including active surveillance, cryotherapy, and ADT. After discussing the options the patient elects for cryotherapy. Risks/benefits/alternatives discussed   No follow-ups on file.  Nicolette Bang, MD  [facility] [place]

## 2021-05-17 ENCOUNTER — Other Ambulatory Visit: Payer: Self-pay

## 2021-05-17 DIAGNOSIS — C61 Malignant neoplasm of prostate: Secondary | ICD-10-CM

## 2021-05-17 NOTE — Progress Notes (Signed)
[facility] Surgery Posting Form   Surgery Date/Time: Date: 06/05/2021  Surgeon: Dr. Nicolette Bang, MD  Surgery Location: [place] ( No  )   Outpt (Yes)   Obs ( No  )   Diagnosis: C61 Prostate Cancer   -CPT: [phone]  Surgery: Cryo Ablation of the Prostate  Stop Anticoagulations: No  Cardiac/Medical/Pulmonary Clearance needed: no  *Orders entered into EPIC  Date: 05/17/21   *Case booked in [place]  Date: 05/16/2021  *Notified pt of Surgery: Date: 05/16/2021  *Placed into Prior Authorization Work Fabio Bering Date: 05/17/21   Assistant/laser/rep:Yes, Sampson Si with [employer] for cryotherapy probes

## 2021-05-29 ENCOUNTER — Ambulatory Visit: Payer: BC Managed Care – PPO | Admitting: Urology

## 2021-06-05 ENCOUNTER — Encounter (HOSPITAL_BASED_OUTPATIENT_CLINIC_OR_DEPARTMENT_OTHER): Admission: RE | Payer: Self-pay | Source: Home / Self Care

## 2021-06-05 ENCOUNTER — Ambulatory Visit (HOSPITAL_BASED_OUTPATIENT_CLINIC_OR_DEPARTMENT_OTHER): Admission: RE | Admit: 2021-06-05 | Payer: Medicare Other | Source: Home / Self Care | Admitting: Urology

## 2021-06-05 SURGERY — CRYOABLATION, PROSTATE
Anesthesia: General

## 2021-06-12 ENCOUNTER — Encounter: Payer: Self-pay | Admitting: Urology

## 2021-06-14 ENCOUNTER — Ambulatory Visit: Payer: BC Managed Care – PPO | Admitting: Urology

## 2021-06-26 ENCOUNTER — Encounter: Payer: Self-pay | Admitting: Urology

## 2021-06-26 ENCOUNTER — Other Ambulatory Visit: Payer: Self-pay

## 2021-06-26 ENCOUNTER — Ambulatory Visit (INDEPENDENT_AMBULATORY_CARE_PROVIDER_SITE_OTHER): Payer: Medicare PPO | Admitting: Urology

## 2021-06-26 VITALS — BP 150/81 | HR 87 | Ht 73.0 in | Wt 205.0 lb

## 2021-06-26 DIAGNOSIS — C61 Malignant neoplasm of prostate: Secondary | ICD-10-CM | POA: Diagnosis not present

## 2021-06-26 LAB — URINALYSIS, ROUTINE W REFLEX MICROSCOPIC
Bilirubin, UA: NEGATIVE
Glucose, UA: NEGATIVE
Leukocytes,UA: NEGATIVE
Nitrite, UA: NEGATIVE
Protein,UA: NEGATIVE
RBC, UA: NEGATIVE
Specific Gravity, UA: 1.025 (ref 1.005–1.030)
Urobilinogen, Ur: 0.2 mg/dL (ref 0.2–1.0)
pH, UA: 5.5 (ref 5.0–7.5)

## 2021-06-26 NOTE — Progress Notes (Signed)

## 2021-06-26 NOTE — Addendum Note (Signed)
Addended by: Dorisann Frames on: 06/26/2021 01:43 PM   Modules accepted: Orders

## 2021-06-26 NOTE — Patient Instructions (Signed)
Prostate Cancer The prostate is a small gland that produces fluid that makes up semen (seminal fluid). It is located below the bladder in men, in front of the rectum. Prostate cancer is the abnormal growth of cells in the prostate gland. What are the causes? The exact cause of this condition is not known. What increases the risk? You are more likely to develop this condition if: You are 75 years of age or older. You have a family history of prostate cancer. You have a family history of breast and ovarian cancer. You have genes that are passed from parent to child (inherited), such as BRCA1 and BRCA2. You have Lynch syndrome. African American men and men of African descent are diagnosed with prostate cancer at higher rates than other men. The reasons for this are not well understood and are likely due to a combination of genetic and environmental factors. What are the signs or symptoms? Symptoms of this condition include: Problems with urination. This may include: A weak or interrupted flow of urine. Trouble starting or stopping urination. Trouble emptying the bladder all the way. The need to urinate more often, especially at night. Blood in urine or semen. Persistent pain or discomfort in the lower back, lower abdomen, or hips. Trouble getting an erection. Weakness or numbness in the legs or feet. How is this diagnosed? This condition can be diagnosed with: A digital rectal exam. For this exam, a health care provider inserts a gloved finger into the rectum to feel the prostate gland. A blood test called a prostate-specific antigen (PSA) test. A procedure in which a sample of tissue is taken from the prostate and checked under a microscope (prostate biopsy). An imaging test called transrectal ultrasonography. Once the condition is diagnosed, tests will be done to determine how far the cancer has spread. This is called staging the cancer. Staging may involve imaging tests, such as a bone  scan, CT scan, PET scan, or MRI. Stages of prostate cancer The stages of prostate cancer are as follows: Stage 1 (I). At this stage, the cancer is found in the prostate only. The cancer is not visible on imaging tests, and it is usually found by accident, such as during prostate surgery. Stage 2 (II). At this stage, the cancer is more advanced than it is in stage 1, but the cancer has not spread outside the prostate. Stage 3 (III). At this stage, the cancer has spread beyond the outer layer of the prostate to nearby tissues. The cancer may be found in the seminal vesicles, which are near the bladder and the prostate. Stage 4 (IV). At this stage, the cancer has spread to other parts of the body, such as the lymph nodes, bones, bladder, rectum, liver, or lungs. Prostate cancer grading Prostate cancer is also graded according to how the cancer cells look under a microscope. This is called the Gleason score and the total score can range from 6-10, indicating how likely it is that the cancer will spread (metastasize) to other parts of the body. The higher the score, the greater the likelihood that the cancer will spread. Gleason 6 or lower: This indicates that the cancer cells look similar to normal prostate cells (well differentiated). Gleason 7: This indicates that the cancer cells look somewhat similar to normal prostate cells (moderately differentiated). Gleason 8, 9, or 10: This indicates that the cancer cells look very different than normal prostate cells (poorly differentiated). How is this treated? Treatment for this condition depends on several factors,  including the stage of the cancer, your age, personal preferences, and your overall health. Talk with your health care provider about treatment options that are recommended for you. Common treatments include: °Observation for early stage prostate cancer (active surveillance). This involves having exams, blood tests, and in some cases, more biopsies.  For some men, this is the only treatment needed. °Surgery. Types of surgeries include: °Open surgery (radical prostatectomy). In this surgery, a larger incision is made to remove the prostate. °A laparoscopic radical prostatectomy. This is a surgery to remove the prostate and lymph nodes through several small incisions. It is often referred to as a minimally invasive surgery. °A robotic radical prostatectomy. This is laparoscopic surgery to remove the prostate and lymph nodes with the help of robotic arms that are controlled by the surgeon. °Cryoablation. This is surgery to freeze and destroy cancer cells. °Radiation treatment. Types of radiation treatment include: °External beam radiation. This type aims beams of radiation from outside the body at the prostate to destroy cancerous cells. °Brachytherapy. This type uses radioactive needles, seeds, wires, or tubes that are implanted into the prostate gland. Like external beam radiation, brachytherapy destroys cancerous cells. An advantage is that this type of radiation limits the damage to surrounding tissue and has fewer side effects. °Chemotherapy. This treatment kills cancer cells or stops them from multiplying. It kills both cancer cells and normal cells. °Targeted therapy. This treatment uses medicines to kill cancer cells without damaging normal cells. °Hormone treatment. This treatment involves taking medicines that act on testosterone, one of the male hormones, by: °Stopping your body from producing testosterone. °Blocking testosterone from reaching cancer cells. °Follow these instructions at home: °Lifestyle °Do not use any products that contain nicotine or tobacco. These products include cigarettes, chewing tobacco, and vaping devices, such as e-cigarettes. If you need help quitting, ask your health care provider. °Eat a healthy diet. To do this: °Eat foods that are high in fiber. These include beans, whole grains, and fresh fruits and vegetables. °Limit  foods that are high in fat and sugar. These include fried or sweet foods. °Treatment for prostate cancer may affect sexual function. If you have a partner, continue to have intimate moments. This may include touching, holding, hugging, and caressing your partner. °Get plenty of sleep. °Consider joining a support group for men who have prostate cancer. Meeting with a support group may help you learn to manage the stress of having cancer. °General instructions °Take over-the-counter and prescription medicines only as told by your health care provider. °If you have to go to the hospital, notify your cancer specialist (oncologist). °Keep all follow-up visits. This is important. °Where to find more information °American Cancer Society: www.cancer.org °American Society of Clinical Oncology: www.cancer.net °National Cancer Institute: www.cancer.gov °Contact a health care provider if: °You have new or increasing trouble urinating. °You have new or increasing blood in your urine. °You have new or increasing pain in your hips, back, or chest. °Get help right away if: °You have weakness or numbness in your legs. °You cannot control urination or your bowel movements (incontinence). °You have chills or a fever. °Summary °The prostate is a small gland that is involved in the production of semen. It is located below a man's bladder, in front of the rectum. °Prostate cancer is the abnormal growth of cells in the prostate gland. °Treatment for this condition depends on the stage of the cancer, your age, personal preferences, and your overall health. Talk with your health care provider about treatment   options that are recommended for you. °Consider joining a support group for men who have prostate cancer. Meeting with a support group may help you learn to manage the stress of having cancer. °This information is not intended to replace advice given to you by your health care provider. Make sure you discuss any questions you have with  your health care provider. °Document Revised: 08/31/2020 Document Reviewed: 08/31/2020 °Elsevier Patient Education © 2022 [employer]. ° °

## 2021-06-26 NOTE — Progress Notes (Signed)
06/26/2021 10:58 AM   Kenneth Wheeler 1946/06/16 [id number]  Referring provider: No referring provider defined for this encounter.  Followup prostate cancer   HPI: [name] is 76yo here for followup for prostate cancer. He underwent prostate biopsy which revealed Gleason 4+5=9 in 6/12 cores. PSA 5.3. CT and bone scan from 04/2021 showed no evidence of metastatic disease. He has mild LUTS.    PMH: Past Medical History:  Diagnosis Date   Anxiety    Arthritis    osteo   Cancer ([place]) 2013   prostate   Depression    Dysrhythmia     Surgical History: Past Surgical History:  Procedure Laterality Date   COLONOSCOPY  2010   COLONOSCOPY WITH PROPOFOL N/A 02/26/2019   Procedure: COLONOSCOPY WITH PROPOFOL;  Surgeon: Lollie Sails, MD;  Location: [facility] ENDOSCOPY;  Service: Endoscopy;  Laterality: N/A;   CT RADIATION THERAPY GUIDE  2013   prostate    Home Medications:  Allergies as of 06/26/2021   Not on File      Medication List        Accurate as of June 26, 2021 10:58 AM. If you have any questions, ask your nurse or doctor.          acetaminophen 500 MG tablet Commonly known as: TYLENOL Take 500 mg by mouth every 6 (six) hours as needed.   aspirin 325 MG tablet Take 325 mg by mouth.   aspirin EC 81 MG tablet Take 81 mg by mouth.   buPROPion 150 MG 24 hr tablet Commonly known as: WELLBUTRIN XL Take 150 mg by mouth.   diltiazem 120 MG 12 hr capsule Commonly known as: CARDIZEM SR Take 120 mg by mouth 2 (two) times daily.   diltiazem 120 MG 24 hr capsule Commonly known as: CARDIZEM CD Take 120 mg by mouth daily.   escitalopram 10 MG tablet Commonly known as: LEXAPRO Take 10 mg by mouth.   meloxicam 7.5 MG tablet Commonly known as: MOBIC Take 7.5 mg by mouth daily.   multivitamin-iron-minerals-folic acid Tabs tablet Take 1 tablet by mouth daily.   naproxen sodium 220 MG tablet Commonly known as: ALEVE Take 220 mg by mouth.   tadalafil  10 MG tablet Commonly known as: CIALIS Take 20 mg by mouth.        Allergies: Not on File  Family History: No family history on file.  Social History:  reports that he has never smoked. He has never used smokeless tobacco. He reports that he does not currently use alcohol. He reports that he does not use drugs.  ROS: All other review of systems were reviewed and are negative except what is noted above in HPI  Physical Exam: BP (!) 150/81    Pulse 87    Ht 6\' 1"  (1.854 m)    Wt 205 lb (93 kg)    BMI 27.05 kg/m   Constitutional:  Alert and oriented, No acute distress. HEENT: [place] AT, moist mucus membranes.  Trachea midline, no masses. Cardiovascular: No clubbing, cyanosis, or edema. Respiratory: Normal respiratory effort, no increased work of breathing. GI: Abdomen is soft, nontender, nondistended, no abdominal masses GU: No CVA tenderness.  Lymph: No cervical or inguinal lymphadenopathy. Skin: No rashes, bruises or suspicious lesions. Neurologic: Grossly intact, no focal deficits, moving all 4 extremities. Psychiatric: Normal mood and affect.  Laboratory Data: No results found for: WBC, HGB, HCT, MCV, PLT  Lab Results  Component Value Date   CREATININE 0.96 04/13/2021  No results found for: PSA  No results found for: TESTOSTERONE  No results found for: HGBA1C  Urinalysis    Component Value Date/Time   APPEARANCEUR Clear 03/03/2021 0935   GLUCOSEU Negative 03/03/2021 0935   BILIRUBINUR Negative 03/03/2021 0935   PROTEINUR Negative 03/03/2021 0935   NITRITE Negative 03/03/2021 0935   LEUKOCYTESUR Negative 03/03/2021 0935    Lab Results  Component Value Date   LABMICR Comment 03/03/2021    Pertinent Imaging:  No results found for this or any previous visit.  No results found for this or any previous visit.  No results found for this or any previous visit.  No results found for this or any previous visit.  No results found for this or any previous  visit.  No results found for this or any previous visit.  No results found for this or any previous visit.  No results found for this or any previous visit.   Assessment & Plan:    1. Prostate cancer ([place]) -I discussed the natural history of high risk prostate cancer with the patient and the various treatment options including active surveillance, cryotherapy, HIFU and ADT. After discussing the options the patient elects for cryotherapy versus salvage prostatectomy. I will refer the patient to Dr. Dimas Millin at [facility] for consideration of salvage prostatectomy   No follow-ups on file.  Nicolette Bang, MD  [facility] [place]

## 2021-07-06 ENCOUNTER — Encounter: Payer: Self-pay | Admitting: Gastroenterology

## 2021-07-06 NOTE — H&P (Signed)
Pre-Procedure H&P   Patient ID: Kenneth Wheeler is a 76 y.o. male.  Gastroenterology Provider: Annamaria Helling, DO  Referring Provider: Laurine Blazer, PA PCP: Default, Provider, MD  Date: 07/07/2021  HPI Kenneth Wheeler is a 76 y.o. male who presents today for Colonoscopy for Surveillance-personal history of colon polyps.  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  Also noted to have left-sided diverticulosis, colonic AVMs and internal hemorrhoids.  Patient having regular bowel movements without blood or melena.  No family history of colorectal cancer or polyps.  No other acute GI complaints.  Past Medical History:  Diagnosis Date   Anxiety    Arthritis    osteo   Cancer ([place]) 2013   prostate   Depression    Dysrhythmia     Past Surgical History:  Procedure Laterality Date   COLONOSCOPY  2010   COLONOSCOPY WITH PROPOFOL N/A 02/26/2019   Procedure: COLONOSCOPY WITH PROPOFOL;  Surgeon: Lollie Sails, MD;  Location: [facility] ENDOSCOPY;  Service: Endoscopy;  Laterality: N/A;   CT RADIATION THERAPY GUIDE  2013   prostate   PROSTATE BIOPSY      Family History No h/o GI disease or malignancy  Review of Systems  Constitutional:  Negative for activity change, appetite change, chills, diaphoresis, fatigue, fever and unexpected weight change.  HENT:  Negative for trouble swallowing and voice change.   Respiratory:  Negative for shortness of breath and wheezing.   Cardiovascular:  Negative for chest pain, palpitations and leg swelling.  Gastrointestinal:  Negative for abdominal distention, abdominal pain, anal bleeding, blood in stool, constipation, diarrhea, nausea and vomiting.  Musculoskeletal:  Negative for arthralgias and myalgias.  Skin:  Negative for color change and pallor.  Neurological:  Negative for dizziness, syncope and weakness.  Psychiatric/Behavioral:   Negative for confusion. The patient is not nervous/anxious.   All other systems reviewed and are negative.   Medications No current facility-administered medications on file prior to encounter.   Current Outpatient Medications on File Prior to Encounter  Medication Sig Dispense Refill   aspirin EC 81 MG tablet Take 81 mg by mouth.     diltiazem (CARDIZEM CD) 120 MG 24 hr capsule Take 120 mg by mouth daily.     multivitamin-iron-minerals-folic acid (THERAPEUTIC-M) TABS tablet Take 1 tablet by mouth daily.     Zinc Sulfate (ZINC 15 PO) Take 15 mg by mouth daily.     acetaminophen (TYLENOL) 500 MG tablet Take 500 mg by mouth every 6 (six) hours as needed. (Patient not taking: Reported on 03/03/2021)     aspirin 325 MG tablet Take 325 mg by mouth. (Patient not taking: Reported on 03/03/2021)     buPROPion (WELLBUTRIN XL) 150 MG 24 hr tablet Take 150 mg by mouth. (Patient not taking: Reported on 03/03/2021)     diltiazem (CARDIZEM SR) 120 MG 12 hr capsule Take 120 mg by mouth 2 (two) times daily. (Patient not taking: Reported on 03/03/2021)     escitalopram (LEXAPRO) 10 MG tablet Take 10 mg by mouth. (Patient not taking: Reported on 03/03/2021)     meloxicam (MOBIC) 7.5 MG tablet Take 7.5 mg by mouth daily. (Patient not taking: Reported on 07/07/2021)     naproxen sodium (ALEVE) 220 MG tablet Take 220 mg by mouth. (Patient not taking: Reported on 03/03/2021)     tadalafil (CIALIS) 10 MG tablet Take 20 mg by mouth.  Pertinent medications related to GI and procedure were reviewed by me with the patient prior to the procedure   Current Facility-Administered Medications:    0.9 %  sodium chloride infusion, , Intravenous, Continuous, Tadao, Emig, DO, Last Rate: 20 mL/hr at 07/07/21 0939, New Bag at 07/07/21 0939      No Known Allergies Allergies were reviewed by me prior to the procedure  Objective    Vitals:   07/07/21 0919  BP: (!) 141/95  Pulse: 90  Resp: 16  Temp: (!) 97.5  F (36.4 C)  TempSrc: Temporal  SpO2: 99%  Weight: 93 kg  Height: 6\' 1"  (1.854 m)     Physical Exam Vitals and nursing note reviewed.  Constitutional:      General: He is not in acute distress.    Appearance: Normal appearance. He is not ill-appearing, toxic-appearing or diaphoretic.  HENT:     Head: Normocephalic and atraumatic.     Nose: Nose normal.     Mouth/Throat:     Mouth: Mucous membranes are moist.     Pharynx: Oropharynx is clear.  Eyes:     General: No scleral icterus.    Extraocular Movements: Extraocular movements intact.  Cardiovascular:     Rate and Rhythm: Normal rate and regular rhythm.     Heart sounds: Normal heart sounds. No murmur heard.   No friction rub. No gallop.  Pulmonary:     Effort: Pulmonary effort is normal. No respiratory distress.     Breath sounds: Normal breath sounds. No wheezing, rhonchi or rales.  Abdominal:     General: Bowel sounds are normal. There is no distension.     Palpations: Abdomen is soft.     Tenderness: There is no abdominal tenderness. There is no guarding or rebound.  Musculoskeletal:     Cervical back: Neck supple.     Right lower leg: No edema.     Left lower leg: No edema.  Skin:    General: Skin is warm and dry.     Coloration: Skin is not jaundiced or pale.  Neurological:     General: No focal deficit present.     Mental Status: He is alert and oriented to person, place, and time. Mental status is at baseline.  Psychiatric:        Mood and Affect: Mood normal.        Behavior: Behavior normal.        Thought Content: Thought content normal.        Judgment: Judgment normal.     Assessment:  Kenneth Wheeler is a 76 y.o. male  who presents today for Colonoscopy for Surveillance-personal history of colon polyps.  Plan:  Colonoscopy with possible intervention today  Colonoscopy with possible biopsy, control of bleeding, polypectomy, and interventions as necessary has been discussed with the  patient/patient representative. Informed consent was obtained from the patient/patient representative after explaining the indication, nature, and risks of the procedure including but not limited to death, bleeding, perforation, missed neoplasm/lesions, cardiorespiratory compromise, and reaction to medications. Opportunity for questions was given and appropriate answers were provided. Patient/patient representative has verbalized understanding is amenable to undergoing the procedure.   Annamaria Helling, DO  [facility]  Portions of the record may have been created with voice recognition software. Occasional wrong-word or 'sound-a-like' substitutions may have occurred due to the inherent limitations of voice recognition software.  Read the chart carefully and recognize, using context, where substitutions may have occurred.

## 2021-07-07 ENCOUNTER — Other Ambulatory Visit: Payer: Self-pay

## 2021-07-07 ENCOUNTER — Ambulatory Visit
Admission: RE | Admit: 2021-07-07 | Discharge: 2021-07-07 | Disposition: A | Discharge status: Home / Self Care | Payer: Medicare PPO | Attending: Gastroenterology | Admitting: Gastroenterology

## 2021-07-07 ENCOUNTER — Ambulatory Visit: Payer: Medicare PPO | Admitting: Certified Registered Nurse Anesthetist

## 2021-07-07 ENCOUNTER — Encounter: Payer: Self-pay | Admitting: Gastroenterology

## 2021-07-07 ENCOUNTER — Encounter
Admission: RE | Disposition: A | Discharge status: Home / Self Care | Payer: Self-pay | Source: Home / Self Care | Attending: Gastroenterology

## 2021-07-07 DIAGNOSIS — Z79899 Other long term (current) drug therapy: Secondary | ICD-10-CM | POA: Diagnosis not present

## 2021-07-07 DIAGNOSIS — Z86010 Personal history of colonic polyps: Secondary | ICD-10-CM | POA: Diagnosis present

## 2021-07-07 DIAGNOSIS — D123 Benign neoplasm of transverse colon: Secondary | ICD-10-CM | POA: Insufficient documentation

## 2021-07-07 DIAGNOSIS — F419 Anxiety disorder, unspecified: Secondary | ICD-10-CM | POA: Diagnosis not present

## 2021-07-07 DIAGNOSIS — K641 Second degree hemorrhoids: Secondary | ICD-10-CM | POA: Insufficient documentation

## 2021-07-07 DIAGNOSIS — F32A Depression, unspecified: Secondary | ICD-10-CM | POA: Insufficient documentation

## 2021-07-07 DIAGNOSIS — K5730 Diverticulosis of large intestine without perforation or abscess without bleeding: Secondary | ICD-10-CM | POA: Insufficient documentation

## 2021-07-07 DIAGNOSIS — D124 Benign neoplasm of descending colon: Secondary | ICD-10-CM | POA: Insufficient documentation

## 2021-07-07 HISTORY — PX: COLONOSCOPY WITH PROPOFOL: SHX5780

## 2021-07-07 SURGERY — COLONOSCOPY WITH PROPOFOL
Anesthesia: General

## 2021-07-07 MED ORDER — PROPOFOL 500 MG/50ML IV EMUL
INTRAVENOUS | Status: DC | PRN
  Administered 2021-07-07: 150 ug/kg/min via INTRAVENOUS

## 2021-07-07 MED ORDER — DEXMEDETOMIDINE (PRECEDEX) IN NS 20 MCG/5ML (4 MCG/ML) IV SYRINGE
PREFILLED_SYRINGE | INTRAVENOUS | Status: DC | PRN
  Administered 2021-07-07 (×2): 4 ug via INTRAVENOUS

## 2021-07-07 MED ORDER — LIDOCAINE HCL (CARDIAC) PF 100 MG/5ML IV SOSY
PREFILLED_SYRINGE | INTRAVENOUS | Status: DC | PRN
  Administered 2021-07-07: 50 mg via INTRAVENOUS

## 2021-07-07 MED ORDER — PROPOFOL 10 MG/ML IV BOLUS
INTRAVENOUS | Status: DC | PRN
  Administered 2021-07-07 (×2): 20 mg via INTRAVENOUS
  Administered 2021-07-07: 60 mg via INTRAVENOUS
  Administered 2021-07-07 (×3): 20 mg via INTRAVENOUS

## 2021-07-07 MED ORDER — SODIUM CHLORIDE 0.9 % IV SOLN: INTRAVENOUS | Status: DC

## 2021-07-07 NOTE — Anesthesia Postprocedure Evaluation (Signed)
Anesthesia Post Note  Patient: Kenneth Wheeler  Procedure(s) Performed: COLONOSCOPY WITH PROPOFOL  Patient location during evaluation: Endoscopy Anesthesia Type: General Level of consciousness: awake and alert Pain management: pain level controlled Vital Signs Assessment: post-procedure vital signs reviewed and stable Respiratory status: spontaneous breathing, nonlabored ventilation, respiratory function stable and patient connected to nasal cannula oxygen Cardiovascular status: blood pressure returned to baseline and stable Postop Assessment: no apparent nausea or vomiting Anesthetic complications: no   No notable events documented.   Last Vitals:  Vitals:   07/07/21 1100 07/07/21 1110  BP: 119/80 127/77  Pulse: 93 80  Resp: (!) 21 17  Temp:    SpO2: 97% 99%    Last Pain:  Vitals:   07/07/21 1110  TempSrc:   PainSc: 0-No pain                 Precious Haws [name]

## 2021-07-07 NOTE — Anesthesia Preprocedure Evaluation (Signed)
Anesthesia Evaluation  Patient identified by MRN, date of birth, ID band Patient awake    Reviewed: Allergy & Precautions, H&P , NPO status , Patient's Chart, lab work & pertinent test results  History of Anesthesia Complications Negative for: history of anesthetic complications  Airway Mallampati: II  TM Distance: >3 FB Neck ROM: full    Dental  (+) Poor Dentition, Chipped, Missing   Pulmonary neg COPD,    Pulmonary exam normal        Cardiovascular Exercise Tolerance: Good (-) angina(-) Past MI and (-) Cardiac Stents Normal cardiovascular exam+ dysrhythmias      Neuro/Psych PSYCHIATRIC DISORDERS Anxiety Depression negative neurological ROS  negative psych ROS   GI/Hepatic negative GI ROS, Neg liver ROS, neg GERD  ,  Endo/Other  negative endocrine ROS  Renal/GU negative Renal ROS  negative genitourinary   Musculoskeletal  (+) Arthritis ,   Abdominal   Peds  Hematology negative hematology ROS (+)   Anesthesia Other Findings Past Medical History: No date: Anxiety No date: Arthritis     Comment:  osteo 2013: Cancer ([place])     Comment:  prostate No date: Depression No date: Dysrhythmia  Past Surgical History: 2010: COLONOSCOPY 2013: CT RADIATION THERAPY GUIDE     Comment:  prostate  BMI    Body Mass Index: 25.73 kg/m      Reproductive/Obstetrics negative OB ROS                             Anesthesia Physical  Anesthesia Plan  ASA: 3  Anesthesia Plan: General   Post-op Pain Management:    Induction: Intravenous  PONV Risk Score and Plan: Propofol infusion and TIVA  Airway Management Planned: Natural Airway and Nasal Cannula  Additional Equipment:   Intra-op Plan:   Post-operative Plan:   Informed Consent: I have reviewed the patients History and Physical, chart, labs and discussed the procedure including the risks, benefits and alternatives for the proposed  anesthesia with the patient or authorized representative who has indicated his/her understanding and acceptance.     Dental Advisory Given  Plan Discussed with: Anesthesiologist and CRNA  Anesthesia Plan Comments: (Patient consented for risks of anesthesia including but not limited to:  - adverse reactions to medications - risk of airway placement if required - damage to eyes, teeth, lips or other oral mucosa - nerve damage due to positioning  - sore throat or hoarseness - Damage to heart, brain, nerves, lungs, other parts of body or loss of life  Patient voiced understanding.)        Anesthesia Quick Evaluation

## 2021-07-07 NOTE — Op Note (Signed)
[facility] Gastroenterology Patient Name: Kenneth Wheeler Procedure Date: 07/07/2021 9:45 AM MRN: [id number] Account #: [id number] Date of Birth: 1946-02-18 Admit Type: Outpatient Age: 76 Room: [facility] ENDO ROOM 1 Gender: Male Note Status: Finalized Instrument Name: Colonoscope [id number] Procedure:             Colonoscopy Indications:           High risk colon cancer surveillance: Personal history                         of colonic polyps Providers:             Annamaria Helling DO, DO Referring MD:          No Local Md, MD (Referring MD) Medicines:             Monitored Anesthesia Care Complications:         No immediate complications. Estimated blood loss:                         Minimal. Procedure:             Pre-Anesthesia Assessment:                        - Prior to the procedure, a History and Physical was                         performed, and patient medications and allergies were                         reviewed. The patient is competent. The risks and                         benefits of the procedure and the sedation options and                         risks were discussed with the patient. All questions                         were answered and informed consent was obtained.                         Patient identification and proposed procedure were                         verified by the physician, the nurse, the anesthetist                         and the technician in the endoscopy suite. Mental                         Status Examination: alert and oriented. Airway                         Examination: normal oropharyngeal airway and neck                         mobility. Respiratory Examination: clear to  auscultation. CV Examination: RRR, no murmurs, no S3                         or S4. Prophylactic Antibiotics: The patient does not                         require prophylactic antibiotics. Prior                          Anticoagulants: The patient has taken no previous                         anticoagulant or antiplatelet agents. ASA Grade                         Assessment: III - A patient with severe systemic                         disease. After reviewing the risks and benefits, the                         patient was deemed in satisfactory condition to                         undergo the procedure. The anesthesia plan was to use                         monitored anesthesia care (MAC). Immediately prior to                         administration of medications, the patient was                         re-assessed for adequacy to receive sedatives. The                         heart rate, respiratory rate, oxygen saturations,                         blood pressure, adequacy of pulmonary ventilation, and                         response to care were monitored throughout the                         procedure. The physical status of the patient was                         re-assessed after the procedure.                        After obtaining informed consent, the colonoscope was                         passed under direct vision. Throughout the procedure,                         the patient's blood pressure, pulse, and oxygen  saturations were monitored continuously. The                         Colonoscope was introduced through the anus and                         advanced to the the cecum, identified by appendiceal                         orifice and ileocecal valve. The colonoscopy was                         performed without difficulty. The patient tolerated                         the procedure well. The quality of the bowel                         preparation was evaluated using the BBPS [facility] Bowel                         Preparation Scale) with scores of: Right Colon = 3,                         Transverse Colon = 3 and Left Colon = 3 (entire mucosa                          seen well with no residual staining, small fragments                         of stool or opaque liquid). The total BBPS score                         equals 9. The terminal ileum, ileocecal valve,                         appendiceal orifice, and rectum were photographed. Findings:      The perianal and digital rectal examinations were normal. Pertinent       negatives include normal sphincter tone.      Non-bleeding internal hemorrhoids were found during retroflexion. The       hemorrhoids were Grade II (internal hemorrhoids that prolapse but reduce       spontaneously). Estimated blood loss: none.      A few small-mouthed diverticula were found in the recto-sigmoid colon       and sigmoid colon. Estimated blood loss: none.      Three sessile polyps were found in the descending colon (1) and       transverse colon (2). The polyps were 3 to 7 mm in size. These polyps       were removed with a cold snare. Resection and retrieval were complete.       Estimated blood loss was minimal.      The terminal ileum appeared normal.      The exam was otherwise without abnormality on direct and retroflexion       views. Impression:            - Non-bleeding internal hemorrhoids.                        -  Diverticulosis in the recto-sigmoid colon and in the                         sigmoid colon.                        - Three 3 to 7 mm polyps in the descending colon and                         in the transverse colon, removed with a cold snare.                         Resected and retrieved.                        - The examined portion of the ileum was normal.                        - The examination was otherwise normal on direct and                         retroflexion views. Recommendation:        - Discharge patient to home.                        - Resume previous diet.                        - No aspirin, ibuprofen, naproxen, or other                         non-steroidal anti-inflammatory  drugs for 5 days after                         polyp removal.                        - Continue present medications.                        - Await pathology results.                        - Repeat colonoscopy for surveillance based on                         pathology results.                        - Return to referring physician as previously                         scheduled. Procedure Code(s):     --- Professional ---                        [phone], Colonoscopy, flexible; with removal of                         tumor(s), polyp(s), or other lesion(s) by snare  technique Diagnosis Code(s):     --- Professional ---                        Z86.010, Personal history of colonic polyps                        K64.1, Second degree hemorrhoids                        K63.5, Polyp of colon                        K57.30, Diverticulosis of large intestine without                         perforation or abscess without bleeding CPT copyright 2019 [employer]. All rights reserved. The codes documented in this report are preliminary and upon coder review may  be revised to meet current compliance requirements. Attending Participation:      I personally performed the entire procedure. Volney American, DO Annamaria Helling DO, DO 07/07/2021 11:02:22 AM This report has been signed electronically. Number of Addenda: 0 Note Initiated On: 07/07/2021 9:45 AM Scope Withdrawal Time: 0 hours 19 minutes 38 seconds  Total Procedure Duration: 0 hours 24 minutes 32 seconds  Estimated Blood Loss:  Estimated blood loss was minimal.      [facility]

## 2021-07-07 NOTE — Transfer of Care (Signed)
Immediate Anesthesia Transfer of Care Note  Patient: Kenneth Wheeler  Procedure(s) Performed: COLONOSCOPY WITH PROPOFOL  Patient Location: Endoscopy Unit  Anesthesia Type:General  Level of Consciousness: drowsy  Airway & Oxygen Therapy: Patient Spontanous Breathing  Post-op Assessment: Report given to RN and Post -op Vital signs reviewed and stable  Post vital signs: Reviewed and stable  Last Vitals:  Vitals Value Taken Time  BP 102/62 07/07/21 1050  Temp    Pulse 93 07/07/21 1051  Resp 23 07/07/21 1051  SpO2 96 % 07/07/21 1051  Vitals shown include unvalidated device data.  Last Pain:  Vitals:   07/07/21 1050  TempSrc:   PainSc: 0-No pain         Complications: No notable events documented.

## 2021-07-10 ENCOUNTER — Encounter: Payer: Self-pay | Admitting: Gastroenterology

## 2021-07-10 LAB — SURGICAL PATHOLOGY

## 2021-07-13 ENCOUNTER — Telehealth: Payer: Self-pay | Admitting: Urology

## 2021-07-13 NOTE — Telephone Encounter (Signed)
[place] [facility] - 07/31/21 @ 11:00 am With Dr. Marcello Moores Polascik

## 2021-07-13 NOTE — Telephone Encounter (Signed)
Just fyi.

## 2021-08-01 ENCOUNTER — Ambulatory Visit: Payer: Medicare PPO | Admitting: Urology

## 2021-08-01 DIAGNOSIS — C61 Malignant neoplasm of prostate: Secondary | ICD-10-CM

## 2022-04-23 ENCOUNTER — Encounter: Payer: Self-pay | Admitting: Urology

## 2022-08-16 ENCOUNTER — Encounter: Payer: Self-pay | Admitting: Radiology

## 2022-09-10 IMAGING — CT CT ABD-PEL WO/W CM
3 of 9 series · 9 of 46 positions shown, 16 images · IV contrast (omnipaque)
Comparison: None.

CLINICAL DATA: Prostate cancer recurrence, previous history of
prostate cancer 10 years ago creation therapy. Metastatic disease
evaluation

EXAM:
CT ABDOMEN AND PELVIS WITHOUT AND WITH CONTRAST
TECHNIQUE: Multidetector CT imaging of the abdomen and pelvis was performed
following the standard protocol before and following the bolus
administration of intravenous contrast.
CONTRAST:  100mL OMNIPAQUE IOHEXOL 300 MG/ML  SOLN

[Series 4: coronal st · coronal · 0.85mm/px · 3 of 113 slices shown, 4 images]
[im 29/113  soft-tissue]
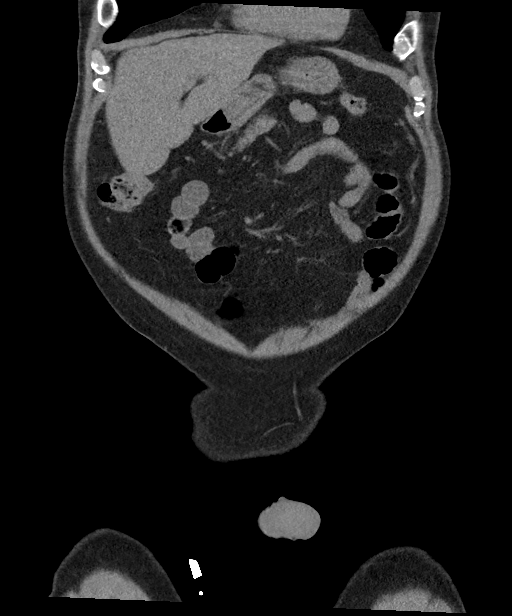
[im 57/113  soft-tissue]
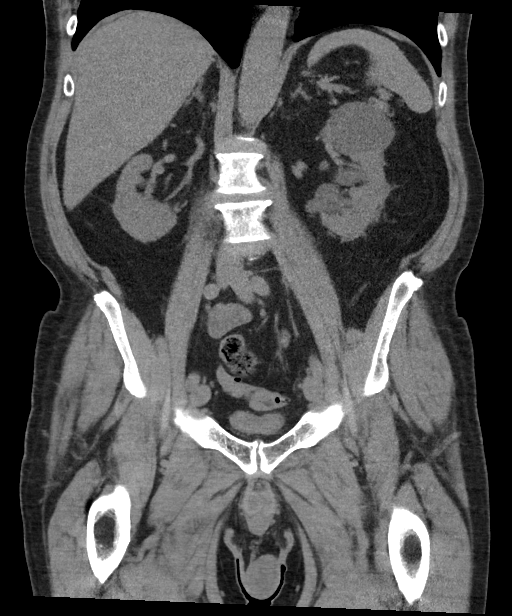
[im 57/113  bone]
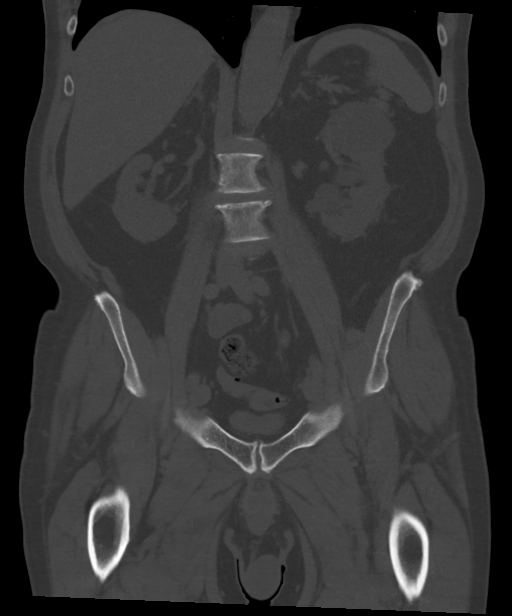
[im 85/113  soft-tissue]
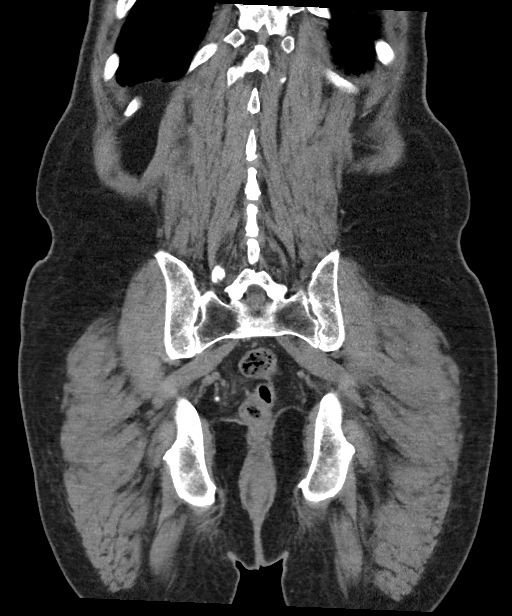

[Series 5: sagittal st · sagittal · 0.69mm/px · 1 of 137 slices shown, 2 images]
[im 3/137  soft-tissue]
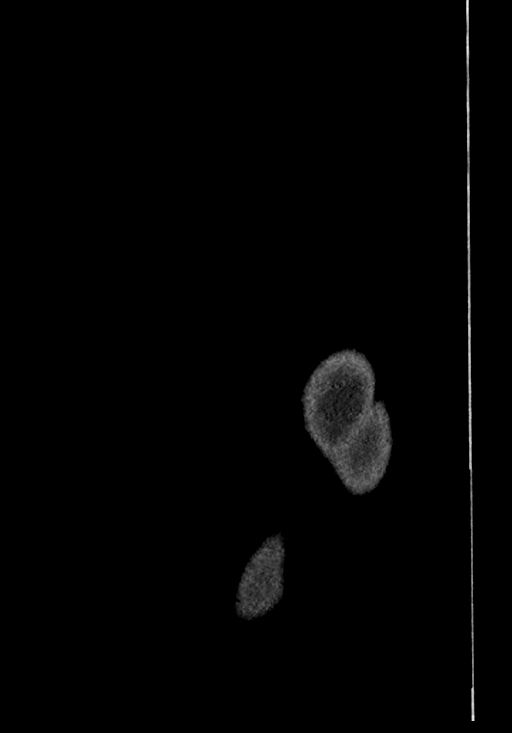
[im 3/137  bone]
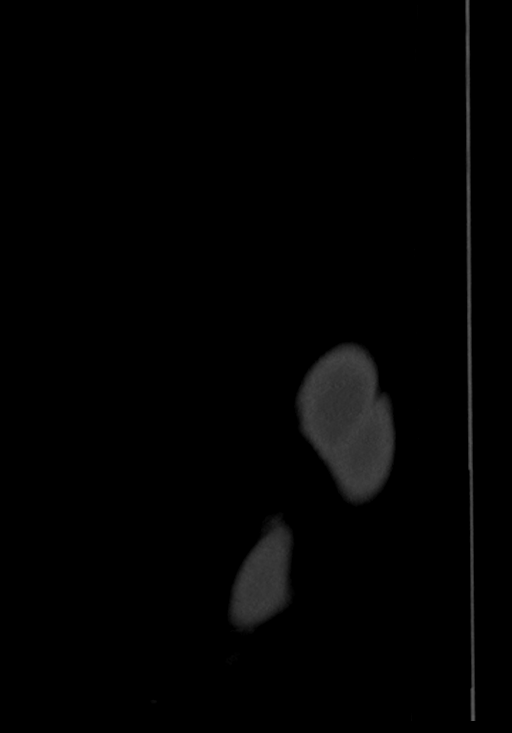

[Series 7: axial st · axial · 0.85mm/px · z∈[+1104,+1444]mm · 5 of 102 slices shown, 10 images]
[im 17/102  soft-tissue]
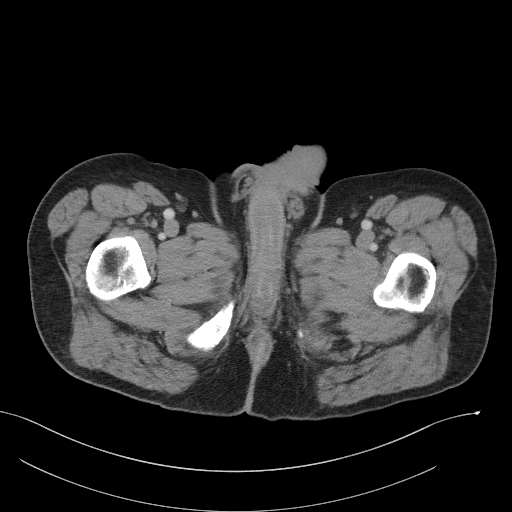
[im 17/102  bone]
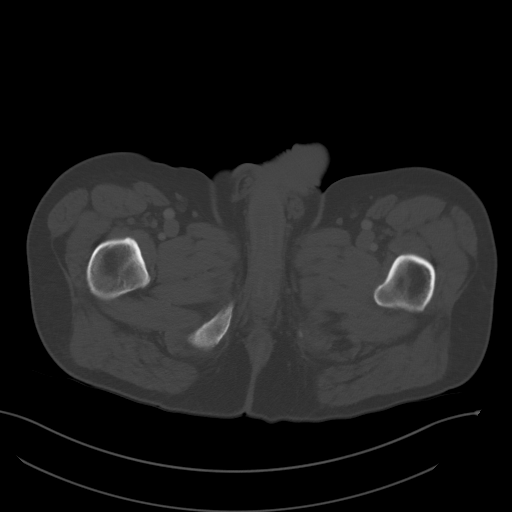
[im 34/102  soft-tissue]
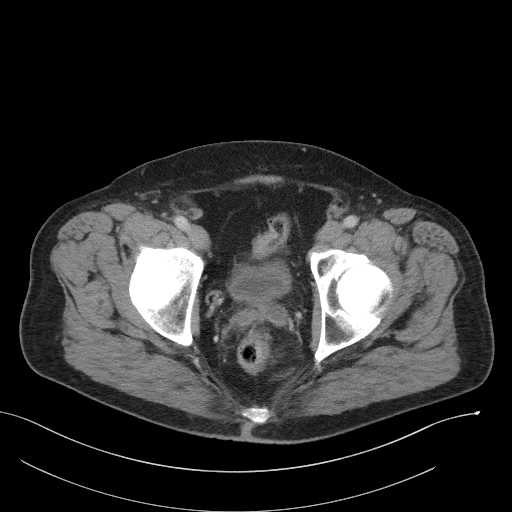
[im 34/102  lung]
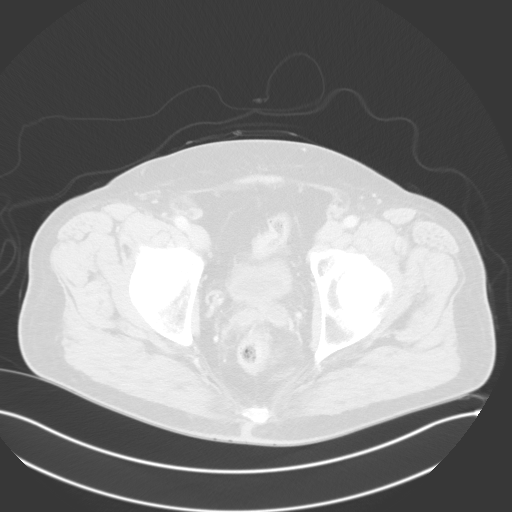
[im 51/102  soft-tissue]
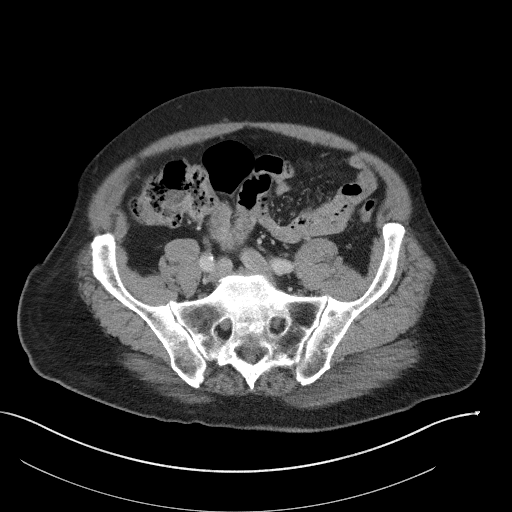
[im 51/102  lung]
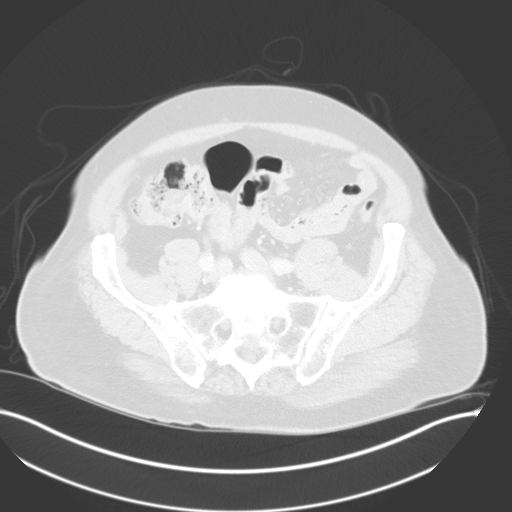
[im 68/102  soft-tissue]
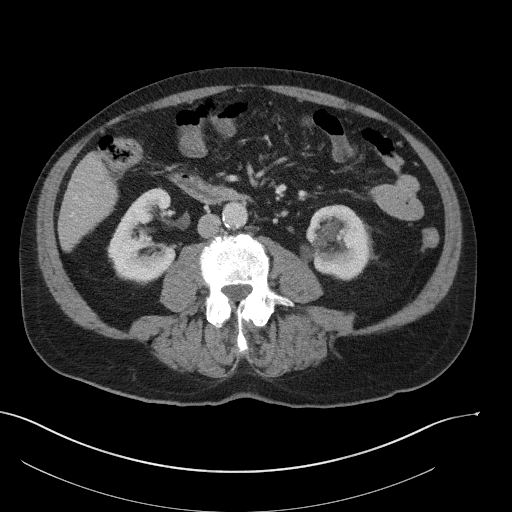
[im 68/102  lung]
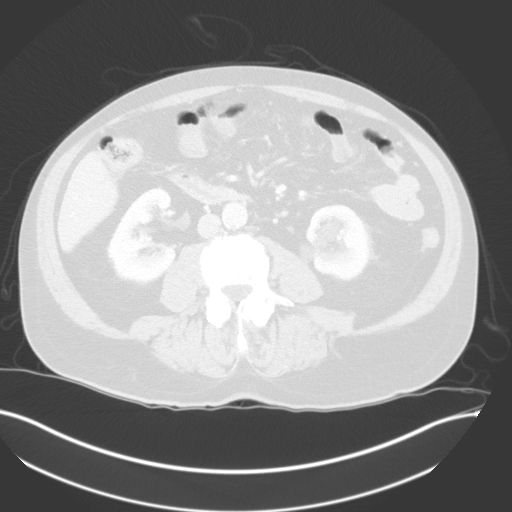
[im 85/102  soft-tissue]
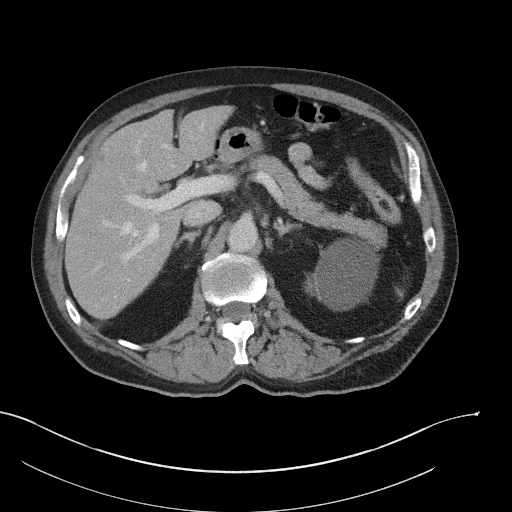
[im 85/102  lung]
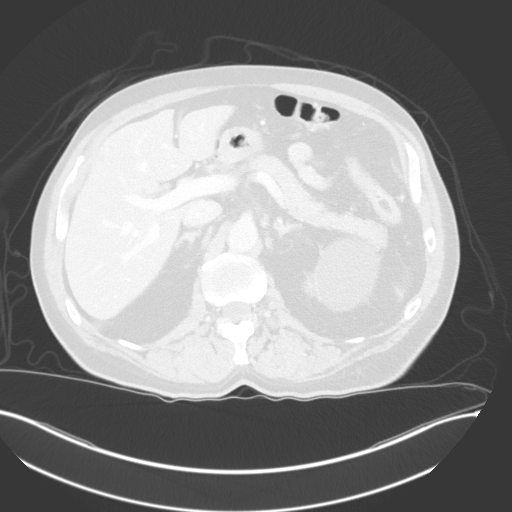

[9 of 46 positions shown; findings below may reference images not displayed]

FINDINGS: Lower chest: No acute abnormality.

Hepatobiliary: Bilobar hepatic cysts measuring up to 2.8 cm.
Additional subcentimeter hypodense hepatic lesions which are too
small to accurately characterize but statistically likely reflect
cysts. No solid enhancing hepatic lesion. Gallbladder is
unremarkable. No biliary ductal dilation.

Pancreas: No pancreatic ductal dilation or evidence of acute
inflammation.

Spleen: Within normal limits.

Adrenals/Urinary Tract: Bilateral adrenal glands are unremarkable.

No hydronephrosis. No renal, ureteral or bladder calculi visualized.
No solid enhancing renal mass. Left upper pole renal cyst measuring
6.8 cm. Bilateral renal sinus cysts.

Kidneys demonstrate symmetric enhancement and excretion of contrast.
No suspicious filling defect visualized within the opacified
portions of the collecting systems or ureters on delayed imaging.

Mild diffuse bladder wall thickening with slightly asymmetric
thickening of the anterior inferior aspect of the urinary bladder
which may be related under distension.

Stomach/Bowel: No enteric contrast administered. Small hiatal hernia
otherwise the stomach is unremarkable for degree of distension. No
pathologic dilation of small or large bowel. The appendix is not
confidently identified however there is no pericecal inflammation.
Terminal ileum appears normal. Colonic diverticulosis without
findings of acute diverticulitis.

Vascular/Lymphatic: Aortic and branch vessel atherosclerosis without
aneurysmal dilation of the abdominal aorta. No pathologically
enlarged abdominal or pelvic lymph nodes.

Reproductive: Radiation markers along the prostate.

Other: No abdominopelvic free fluid.

Musculoskeletal: Multilevel degenerative changes spine. Degenerative
change of the hips and SI joints. No aggressive lytic or blastic
lesion of bone.
IMPRESSION: 1. Radiation therapy markers along the prostate. No discrete
evidence of metastatic disease in the abdomen or pelvis.
2. Mild diffuse bladder wall thickening of a predominantly
decompressed urinary bladder with slightly asymmetric thickening of
the anterior inferior aspect of the urinary bladder which may be
related to under distension and/or prior radiation therapy however
neoplasm not excluded. Consider further evaluation with urinalysis
and/or cystoscopy.
3. Colonic diverticulosis without findings of acute diverticulitis.
4. Small hiatal hernia.
5.  Aortic Atherosclerosis ([id number]).
